# Patient Record
Sex: Male | Born: 1960 | Race: White | Hispanic: No | Marital: Married | Smoking: Former smoker
Health system: Southern US, Community
[De-identification: ages and names within clinical notes are randomized; demographics above are authoritative.]

## PROBLEM LIST (undated history)

## (undated) DIAGNOSIS — K219 Gastro-esophageal reflux disease without esophagitis: Secondary | ICD-10-CM

## (undated) DIAGNOSIS — K4090 Unilateral inguinal hernia, without obstruction or gangrene, not specified as recurrent: Secondary | ICD-10-CM

## (undated) DIAGNOSIS — K8020 Calculus of gallbladder without cholecystitis without obstruction: Secondary | ICD-10-CM

## (undated) DIAGNOSIS — I10 Essential (primary) hypertension: Secondary | ICD-10-CM

## (undated) DIAGNOSIS — M1612 Unilateral primary osteoarthritis, left hip: Secondary | ICD-10-CM

## (undated) DIAGNOSIS — R7303 Prediabetes: Secondary | ICD-10-CM

## (undated) DIAGNOSIS — J189 Pneumonia, unspecified organism: Secondary | ICD-10-CM

## (undated) DIAGNOSIS — M1990 Unspecified osteoarthritis, unspecified site: Secondary | ICD-10-CM

## (undated) DIAGNOSIS — E7800 Pure hypercholesterolemia, unspecified: Secondary | ICD-10-CM

## (undated) DIAGNOSIS — T7840XA Allergy, unspecified, initial encounter: Secondary | ICD-10-CM

## (undated) DIAGNOSIS — J439 Emphysema, unspecified: Secondary | ICD-10-CM

## (undated) DIAGNOSIS — I700 Atherosclerosis of aorta: Secondary | ICD-10-CM

## (undated) HISTORY — DX: Allergy, unspecified, initial encounter: T78.40XA

## (undated) HISTORY — DX: Pure hypercholesterolemia, unspecified: E78.00

## (undated) HISTORY — DX: Unspecified osteoarthritis, unspecified site: M19.90

## (undated) HISTORY — DX: Gastro-esophageal reflux disease without esophagitis: K21.9

## (undated) HISTORY — DX: Essential (primary) hypertension: I10

## (undated) HISTORY — PX: COLONOSCOPY WITH ESOPHAGOGASTRODUODENOSCOPY (EGD): SHX5779

---

## 2008-09-28 ENCOUNTER — Emergency Department: Payer: Self-pay | Admitting: Emergency Medicine

## 2010-08-09 ENCOUNTER — Ambulatory Visit: Payer: Self-pay | Admitting: Family Medicine

## 2012-10-28 HISTORY — PX: OTHER SURGICAL HISTORY: SHX169

## 2013-02-22 ENCOUNTER — Ambulatory Visit: Payer: Self-pay

## 2013-03-02 ENCOUNTER — Ambulatory Visit: Payer: Self-pay | Admitting: Orthopedic Surgery

## 2013-03-02 HISTORY — PX: ORIF METACARPAL FRACTURE: SUR940

## 2013-03-31 ENCOUNTER — Ambulatory Visit: Payer: Self-pay | Admitting: Family Medicine

## 2013-04-10 ENCOUNTER — Ambulatory Visit: Payer: Self-pay | Admitting: Family Medicine

## 2013-04-22 ENCOUNTER — Ambulatory Visit: Payer: Self-pay | Admitting: Orthopedic Surgery

## 2013-04-24 ENCOUNTER — Ambulatory Visit: Payer: Self-pay | Admitting: Orthopedic Surgery

## 2014-05-09 ENCOUNTER — Ambulatory Visit: Payer: Self-pay | Admitting: Gastroenterology

## 2014-08-12 ENCOUNTER — Emergency Department: Payer: Self-pay | Admitting: Student

## 2014-08-12 LAB — COMPREHENSIVE METABOLIC PANEL
Albumin: 4.1 g/dL (ref 3.4–5.0)
Alkaline Phosphatase: 86 U/L
Anion Gap: 10 (ref 7–16)
BUN: 18 mg/dL (ref 7–18)
Bilirubin,Total: 0.5 mg/dL (ref 0.2–1.0)
CALCIUM: 8.9 mg/dL (ref 8.5–10.1)
CO2: 23 mmol/L (ref 21–32)
Chloride: 108 mmol/L — ABNORMAL HIGH (ref 98–107)
Creatinine: 0.84 mg/dL (ref 0.60–1.30)
Glucose: 119 mg/dL — ABNORMAL HIGH (ref 65–99)
Potassium: 4.7 mmol/L (ref 3.5–5.1)
SGOT(AST): 19 U/L (ref 15–37)
SGPT (ALT): 36 U/L
Sodium: 141 mmol/L (ref 136–145)
Total Protein: 8 g/dL (ref 6.4–8.2)

## 2014-08-12 LAB — URINALYSIS, COMPLETE
BLOOD: NEGATIVE
Bacteria: NONE SEEN
Bilirubin,UR: NEGATIVE
GLUCOSE, UR: NEGATIVE mg/dL (ref 0–75)
Ketone: NEGATIVE
Leukocyte Esterase: NEGATIVE
Nitrite: NEGATIVE
PROTEIN: NEGATIVE
Ph: 5 (ref 4.5–8.0)
RBC,UR: NONE SEEN /HPF (ref 0–5)
Specific Gravity: 1.018 (ref 1.003–1.030)
Squamous Epithelial: NONE SEEN
WBC UR: 2 /HPF (ref 0–5)

## 2014-08-12 LAB — LIPASE, BLOOD: Lipase: 102 U/L (ref 73–393)

## 2014-08-12 LAB — CBC WITH DIFFERENTIAL/PLATELET
BASOS ABS: 0.1 10*3/uL (ref 0.0–0.1)
BASOS PCT: 0.8 %
Eosinophil #: 0.1 10*3/uL (ref 0.0–0.7)
Eosinophil %: 0.9 %
HCT: 44.4 % (ref 40.0–52.0)
HGB: 14.4 g/dL (ref 13.0–18.0)
LYMPHS PCT: 8.8 %
Lymphocyte #: 1 10*3/uL (ref 1.0–3.6)
MCH: 30.9 pg (ref 26.0–34.0)
MCHC: 32.3 g/dL (ref 32.0–36.0)
MCV: 96 fL (ref 80–100)
Monocyte #: 0.6 x10 3/mm (ref 0.2–1.0)
Monocyte %: 4.7 %
NEUTROS ABS: 9.9 10*3/uL — AB (ref 1.4–6.5)
Neutrophil %: 84.8 %
Platelet: 279 10*3/uL (ref 150–440)
RBC: 4.65 10*6/uL (ref 4.40–5.90)
RDW: 12.6 % (ref 11.5–14.5)
WBC: 11.7 10*3/uL — ABNORMAL HIGH (ref 3.8–10.6)

## 2014-08-12 LAB — TROPONIN I: Troponin-I: <0.02 ng/mL

## 2014-10-19 DIAGNOSIS — E669 Obesity, unspecified: Secondary | ICD-10-CM | POA: Insufficient documentation

## 2015-02-17 NOTE — Op Note (Signed)
PATIENT NAME:  Jason Dickson, [name] MR#:  [id number] DATE OF BIRTH:  [birth year]  DATE OF PROCEDURE:  03/02/2013  PREOPERATIVE DIAGNOSIS: Base of fifth metacarpal fracture.   POSTOPERATIVE DIAGNOSIS: Base of fifth metacarpal fracture.   PROCEDURE: Open reduction and internal fixation of right fifth metacarpal fracture.   ANESTHESIA: General.   SURGEON: Leitha SchullerMichael J. Alizae Bechtel, M.D.   DESCRIPTION OF PROCEDURE: The patient was brought to the Operating Room and after adequate anesthesia was obtained, the right arm was prepped and draped in the usual sterile fashion with a tourniquet applied to the upper arm. After prepping and draping in the usual sterile fashion, patient identification and timeout procedures were completed. The tourniquet was raised to 250 mmHg. A dorsal ulnar incision was made centered over the proximal fifth metacarpal. Skin and subcutaneous tissue were divided, preserving cutaneous nerve branches as much as possible. The extensor tendons of the little finger were identified and protected, the fracture site visualized and reduction carried out by applying direct pressure to the ulnar-sided fragment. A small T-plate was then shaped and cut short to fit this fragment and applied to the shaft proximally, with 2 screws at the most proximal end to help capture the fragment. The plate was then attached using first nonlocking and then locking screws to fix it to the shaft. Mini C-arm views were obtained during the procedure that showed excellent alignment, with reduction of the displaced intra-articular fragment. After the 6 screws had been placed, the wound was irrigated and closed with a 3-0 Vicryl to close the periosteum over the plate, 4-0 Monocryl subcutaneously and Dermabond. Then, 10 mL of 0.5% Sensorcaine were infiltrated around the incision to aid in postoperative analgesia. Telfa, 4 x 4's, Webril and a volar splint were applied followed by an Ace wrap. Tourniquet was let down at the close of the  case. Tourniquet time was 47 minutes at 250 mmHg.   ____________________________ Leitha SchullerMichael J. Aneth Schlagel, MD mjm:jm D: 03/02/2013 17:44:07 ET T: 03/02/2013 21:07:09 ET JOB#: [id number]  cc: Leitha SchullerMichael J. Stellarose Cerny, MD, <Dictator> Leitha SchullerMICHAEL J Ayuub Penley MD ELECTRONICALLY SIGNED 03/03/2013 8:06

## 2015-04-20 DIAGNOSIS — R7303 Prediabetes: Secondary | ICD-10-CM | POA: Insufficient documentation

## 2015-09-04 ENCOUNTER — Other Ambulatory Visit: Payer: Self-pay | Admitting: Family Medicine

## 2015-09-04 NOTE — Telephone Encounter (Signed)
Apt pe 

## 2015-09-05 NOTE — Telephone Encounter (Signed)
Spoke to patient about making appt for his CPE.  Pt stated he was work and would need to call back.

## 2015-12-04 ENCOUNTER — Other Ambulatory Visit: Payer: Self-pay | Admitting: Family Medicine

## 2016-03-02 ENCOUNTER — Other Ambulatory Visit: Payer: Self-pay | Admitting: Family Medicine

## 2016-03-04 ENCOUNTER — Encounter: Payer: Self-pay | Admitting: Family Medicine

## 2016-03-04 NOTE — Telephone Encounter (Signed)
Apt with anyone 

## 2016-03-04 NOTE — Telephone Encounter (Signed)
Letter sent.

## 2016-10-15 DIAGNOSIS — R3912 Poor urinary stream: Secondary | ICD-10-CM | POA: Insufficient documentation

## 2016-10-15 DIAGNOSIS — N528 Other male erectile dysfunction: Secondary | ICD-10-CM | POA: Insufficient documentation

## 2016-10-15 DIAGNOSIS — N401 Enlarged prostate with lower urinary tract symptoms: Secondary | ICD-10-CM | POA: Insufficient documentation

## 2016-10-15 DIAGNOSIS — I10 Essential (primary) hypertension: Secondary | ICD-10-CM | POA: Insufficient documentation

## 2018-07-31 DIAGNOSIS — M5416 Radiculopathy, lumbar region: Secondary | ICD-10-CM | POA: Insufficient documentation

## 2018-09-07 ENCOUNTER — Ambulatory Visit: Payer: BLUE CROSS/BLUE SHIELD | Attending: Family Medicine

## 2018-09-07 DIAGNOSIS — M545 Low back pain: Secondary | ICD-10-CM | POA: Diagnosis present

## 2018-09-07 DIAGNOSIS — G8929 Other chronic pain: Secondary | ICD-10-CM | POA: Insufficient documentation

## 2018-09-07 DIAGNOSIS — M6281 Muscle weakness (generalized): Secondary | ICD-10-CM | POA: Diagnosis present

## 2018-09-07 DIAGNOSIS — M5417 Radiculopathy, lumbosacral region: Secondary | ICD-10-CM | POA: Diagnosis present

## 2018-09-07 NOTE — Therapy (Signed)
[facility] PHYSICAL [facility]ORTS MEDICINE [address], [place], [address] Phone: [phone]   Fax:  [phone]  Physical Therapy Evaluation  Patient Details  Name: Jason Dickson MRN: [id number] Date of Birth: [birth year] Referring Provider (PT): Marisue Ivan, MD   Encounter Date: 09/07/2018  PT End of Session - 09/07/18 1737    Visit Number  1    Number of Visits  13    Date for PT Re-Evaluation  10/22/18    PT Start Time  1737    PT Stop Time  1849    PT Time Calculation (min)  72 min    Activity Tolerance  Patient tolerated treatment well    Behavior During Therapy  [facility] for tasks assessed/performed       Past Medical History:  Diagnosis Date  . Allergy    Information from [facility]  . Arthritis    Information from [facility]  . GERD (gastroesophageal reflux disease)    Information from [facility]  . Hypercholesterolemia    Information from [facility]  . Hypertension    Information from [facility]    Past Surgical History:  Procedure Laterality Date  . ORIF METACARPAL FRACTURE Right 03/02/2013   Information from [facility]; 5th metacarpal    There were no vitals filed for this visit.   Subjective Assessment - 09/07/18 1739    Subjective  Low back pain (pain sometimes L upper back when lying in supine): 0/10 currently, 8.5/10 at most for the past 3 months (supine position). LE symptoms (L5 dermatome) L > R: numb/tingling/burn sensation.   Has not gone past the knees.     Pertinent History  Chronic lumbar radiculopathy. Pt has had back pain for 2 years, gradual onset. Currently unable to lie flat on his back or L side. Able to lie on his R side. Today is also his last day taking prednisone. Medications helped decrease his back pain a little bit.  Able to get 2.5 hours of sleep at a time prior to his  pain waking him up. Used to be able to bend and twist to make it feel better.  Pt also states that his L leg would tingle > R.  Walking 25 yards feels like walking 10 miles. Legs, hips, and the top of his tail bone would burn like he was working out.  Not being able to do anything makes him increase weight. His physical activities are limited outside of working.  Pt currently is on his feet running machines.   Hips also feel like its rubbing when he walks.  Back also went out around 1999. Went to [place] which made his back feel better.  He was told that one leg is shorter than the other.   Denies loss of bowel or bladder control.  Denies saddle anesthesia.    Back pain has gradually gotten worse since onset 2 years ago.     Patient Stated Goals  Be able to bend and not hurt. Be able to sleep for 7 hours.     Currently in Pain?  No/denies    Pain Score  0-No pain    Pain Location  Back    Pain Orientation  Posterior    Pain Descriptors / Indicators  Aching;Burning   low back and LE burning   Pain Type  Chronic pain    Pain Onset  More  than a month ago    Pain Frequency  Occasional    Aggravating Factors   Laying supine or on his L side. R S/L eventually increases pain. Sitting, standing, walking     Pain Relieving Factors  leaning forward, twisting (causes his back to pop and feel better). Ice pack helps. Seated piriformis stretch hurt initially but feels a lot better afterwards.       Information from [facility]:  (from Marisue Ivan, MD notes on 08/27/2018)  Current Outpatient Medications: CIALIS 20 mg tablet, TAKE 1 TABLET (20 MG TOTAL) BY MOUTH ONCE DAILY AS NEEDED FOR ERECTILE DYSFUNCTION., Taking fluticasone (FLONASE) 50 mcg/actuation nasal spray, Place 1 spray into both nostrils once daily, Taking gabapentin (NEURONTIN) 300 MG capsule, Take 2 capsules (600 mg total) by mouth nightly loratadine (CLARITIN) 10 mg tablet, Take 10 mg by mouth once daily as needed.  , Taking omeprazole (PRILOSEC OTC) 20 MG tablet, Take 20 mg by mouth once daily., Taking simvastatin (ZOCOR) 40 MG tablet, TAKE 1 TABLET (40 MG TOTAL) BY MOUTH EVERY MORNING., Taking tamsulosin (FLOMAX) 0.4 mg capsule, TAKE 1 CAPSULE (0.4 MG TOTAL) BY MOUTH ONCE DAILY. TAKE 30 MINUTES AFTER SAME MEAL EACH DAY., Taking lisinopril (PRINIVIL,ZESTRIL) 5 MG tablet, Take 1 tablet (5 mg total) by mouth once daily predniSONE (DELTASONE) 10 MG tablet, 4 tabs po daily x 3 days, 3 tabs po daily x 3 days, 2 tabs po daily x 3 days, 1 tab po daily x 3 days  Allergies as of 08/27/2018  . (No Known Allergies)   Patient Active Problem List  Diagnosis  . Pure hypercholesterolemia (LDL 93 - 01/02/18)  . Arthritis  . Mild obesity, unspecified  . Borderline diabetes mellitus (A1c 5.9% - 01/02/18) - diet controlled  . Vaccine counseling: TDAP vaccine administered on 04/22/16 (04/22/16)  . Essential hypertension  . Benign prostatic hyperplasia with weak urinary stream  . Other male erectile dysfunction  . Chronic lumbar radiculopathy   Past Medical History:  Diagnosis Date  . Allergy 02/13/62  . Arthritis  . Essential hypertension  . GERD (gastroesophageal reflux disease)  . Pure hypercholesterolemia   Past Surgical History:  Procedure Laterality Date  . FRACTURE SURGERY ?  broken hand  . ORIF METACARPAL FRACTURE Right 03/02/13  Fifth metacarpal  . Right shoulder repair 07/2013     [facility] PT Assessment - 09/07/18 1800      Assessment   Medical Diagnosis  Chronic lumbar radiculopathy    Referring Provider (PT)  Marisue Ivan, MD    Onset Date/Surgical Date  07/31/18   Date PT referral signed. Chronic condition.   Prior Therapy  Pt had chiropractic treatment before which helped.       Precautions   Precaution Comments  no known precautions      Restrictions   Other Position/Activity Restrictions  no known restrictions      Prior Function   Vocation  Full time employment    Vocation  Requirements  PLOF: better able to ambulate longer distances, tolerate standing, supine, and L S/L with less back pain.       Observation/Other Assessments   Observations  Supine position: L LE longer, long sit position: L LE longer    Focus on Therapeutic Outcomes (FOTO)   Lumbar Spine FOTO: 54      Posture/Postural Control   Posture Comments  R lateral shift, B protracted shoulders and neck, kyphosis, decreased lumbar lordosis, decreased B hip extension.  AROM   Lumbar Flexion  WFL, slight L trunk rotation   Feels good for back   Lumbar Extension  limited, with movement preference to L5/S1 area, limited thoracic extension. Reproduced low back pain.     Lumbar - Right Side [facility] with slight reproduction of back pain    Lumbar - Left Side Bend  Limited with R trunk pain.     Lumbar - Right Rotation  WFL   sitting   Lumbar - Left Rotation  WFL   sitting     Strength   Right Hip Flexion  4+/5    Right Hip Extension  4-/5   seated manually resisted   Right Hip ABduction  4/5   seated manually resisted clamshell   Left Hip Flexion  4/5    Left Hip Extension  4-/5   seated manually resisted   Left Hip ABduction  4/5   seated manually resisted clamshell   Right Knee Flexion  4+/5    Right Knee Extension  5/5    Left Knee Flexion  4+/5    Left Knee Extension  5/5      Palpation   Palpation comment  TTP L T12, L4, L5 transverse processes.       Ambulation/Gait   Gait Comments  decreased stance R LE, L femoral adduction during L LE stance phase, R trunk side bend                Objective measurements completed on examination: See above findings.    Pt states blood pressure is controlled.     No latex band allergies   Medbridge Access Code: [email or web address]    Therapeutic exercise   Standing R lateral shift correction 5x5 seconds for 2 sets. L LE symptoms which eases with rest.    standing scapular retraction resisting red band 5x5 seconds. L upper back  discomfort which eases with rest  Sitting scapular retraction resisting red band 2x5 seoconds, R upper back discomfort which eases with rest.   Seated B scapular retraction resisting yellow band 10x5 seconds  Standing glute max squeeze 10x5 seconds  Reviewed HEP. Pt demonstrated and verbalized understanding.     Improved exercise technique, movement at target joints, use of target muscles after mod verbal, visual, tactile cues.    Patient is a 57 year old male who came to physical therapy secondary to chronic low back pain with radiating symptoms. He also presents with altered gait pattern and posture, B hip weakness, TTP to L low back compared to R, radiating symptoms around L 5 dermatome, and difficulty performing functional tasks such as walking or tolerating positions involving lumbar extension. Patient will benefit from skilled physical therapy services to address the aforementioned deficits.                PT Education - 09/07/18 1940    Education Details  Ther-ex, HEP, plan of care    Person(s) Educated  Patient    Methods  Explanation;Demonstration;Tactile cues;Verbal cues;Handout    Comprehension  Returned demonstration;Verbalized understanding       PT Short Term Goals - 09/07/18 1913      PT SHORT TERM GOAL #1   Title  Patient will be independent with his HEP to decrease pain, improve strength and function.     Baseline  Pt has started his HEP. 09/07/2018    Time  3    Period  Weeks    Status  New    Target Date  10/01/18        PT Long Term Goals - 09/07/18 1914      PT LONG TERM GOAL #1   Title  Patient will have a decrease in back pain to 4/10 or less at worst to promote ability to ambulate longer distances, perform standing tasks, improve ability to sleep.     Baseline  8.5/10 back pain at worst for the past 3 months (09/07/2018)    Time  6    Period  Weeks    Status  New    Target Date  10/22/18      PT LONG TERM GOAL #2   Title  Pt will  improve B hip strength by at least 1/2 MMT grade to promote ability to perform standing tasks with less back and LE pain.     Time  6    Period  Weeks    Status  New    Target Date  10/22/18      PT LONG TERM GOAL #3   Title  Patient will report being able to sleep at least 5 hours straight without his back pain waking him up to promote ability to rest.     Baseline  Pt able to sleep 2.5 hours at a time due to back pain waking him up (09/07/2018)    Time  6    Period  Weeks    Status  New    Target Date  10/22/18      PT LONG TERM GOAL #4   Title  Patient will improve his lumbar spine FOTO score by at least 10 points as a demonstration of improved function.     Baseline  FOTO 54 (09/07/2018)    Time  6    Period  Weeks    Status  New    Target Date  10/22/18             Plan - 09/07/18 1903    Clinical Impression Statement  Patient is a 57 year old male who came to physical therapy secondary to chronic low back pain with radiating symptoms. He also presents with altered gait pattern and posture, B hip weakness, TTP to L low back compared to R, radiating symptoms around L 5 dermatome, and difficulty performing functional tasks such as walking or tolerating positions involving lumbar extension. Patient will benefit from skilled physical therapy services to address the aforementioned deficits.     History and Personal Factors relevant to plan of care:  Chronicity of condition, difficulty walking longer distances, standing and laying on his back or side for long periods due to pain, leg length difference    Clinical Presentation  Evolving    Clinical Presentation due to:  patient states pain has gradually worsened since onset.    Clinical Decision Making  Moderate    Rehab Potential  Fair    Clinical Impairments Affecting Rehab Potential  Chronicity of condition    PT Frequency  2x / week    PT Duration  6 weeks    PT Treatment/Interventions  Therapeutic exercise;Neuromuscular  re-education;Therapeutic activities;Patient/family education;Manual techniques;Aquatic Therapy;Electrical Stimulation;Iontophoresis 4mg /ml Dexamethasone;Traction;Ultrasound    PT Next Visit Plan  thoracic, hip extension, glute and trunk strengthening, manual techniques, modalities PRN    Consulted and Agree with Plan of Care  Patient       Patient will benefit from skilled therapeutic intervention in order to improve the following deficits and impairments:  Pain, Improper body mechanics, Postural dysfunction, Decreased strength, Difficulty  walking  Visit Diagnosis: Chronic bilateral low back pain, unspecified whether sciatica present - Plan: PT plan of care cert/re-cert  Radiculopathy, lumbosacral region - Plan: PT plan of care cert/re-cert  Muscle weakness (generalized) - Plan: PT plan of care cert/re-cert     Problem List There are no active problems to display for this patient.   Loralyn Freshwater PT, DPT   09/07/2018, 7:50 PM  [facility] PHYSICAL AND SPORTS MEDICINE [address], [place], [address] Phone: [phone]   Fax:  [phone]  Name: Jason Dickson MRN: [id number] Date of Birth: 24-Jul-1961

## 2018-09-07 NOTE — Patient Instructions (Addendum)
Medbridge Access Code: [email or web address]    Standing scapular retraction 10x3 with 5 second holds   Standing glute max squeeze 10x5 seconds

## 2018-09-09 ENCOUNTER — Ambulatory Visit: Payer: BLUE CROSS/BLUE SHIELD

## 2018-09-09 DIAGNOSIS — M5417 Radiculopathy, lumbosacral region: Secondary | ICD-10-CM

## 2018-09-09 DIAGNOSIS — G8929 Other chronic pain: Secondary | ICD-10-CM

## 2018-09-09 DIAGNOSIS — M545 Low back pain, unspecified: Secondary | ICD-10-CM

## 2018-09-09 DIAGNOSIS — M6281 Muscle weakness (generalized): Secondary | ICD-10-CM

## 2018-09-09 NOTE — Patient Instructions (Addendum)
  Sitting on a chair    Press your hands on your thighs to feel your abdominal muscles contract.   Hold for 5 seconds comfortably.   Repeat 10 times.   Perform 3 sets daily.      1/4 inch heel lift provided for pt for R foot secondary to L LE being longer   Pt can remove it if it bothers him. Pt verbalized understanding.

## 2018-09-09 NOTE — Therapy (Signed)
[facility]ICINE [address], [place], [address] Phone: [phone]   Fax:  [phone]  Physical Therapy Treatment  Patient Details  Name: Jason Dickson MRN: [id number] Date of Birth: Jul 03, 1961 Referring Provider (PT): Marisue Ivan, MD   Encounter Date: 09/09/2018  PT End of Session - 09/09/18 1749    Visit Number  2    Number of Visits  13    Date for PT Re-Evaluation  10/22/18    PT Start Time  1749    PT Stop Time  1836    PT Time Calculation (min)  47 min    Activity Tolerance  Patient tolerated treatment well    Behavior During Therapy  [facility] for tasks assessed/performed       Past Medical History:  Diagnosis Date  . Allergy    Information from [facility]  . Arthritis    Information from [facility]  . GERD (gastroesophageal reflux disease)    Information from [facility]  . Hypercholesterolemia    Information from [facility]  . Hypertension    Information from [facility]    Past Surgical History:  Procedure Laterality Date  . ORIF METACARPAL FRACTURE Right 03/02/2013   Information from [facility]; 5th metacarpal    There were no vitals filed for this visit.  Subjective Assessment - 09/09/18 1752    Subjective  L lateral leg has been tingling since this past Sunday (4 days ago). 0/10 back pain. Feels R low back soreness on the R side. The HEP is ok. Feels some soreness in low back (around L5/S1 area)     Pertinent History  Chronic lumbar radiculopathy. Pt has had back pain for 2 years, gradual onset. Currently unable to lie flat on his back or L side. Able to lie on his R side. Today is also his last day taking prednisone. Medications helped decrease his back pain a little bit.  Able to get 2.5 hours of sleep at a time prior to his pain waking him up. Used to be able to bend  and twist to make it feel better.  Pt also states that his L leg would tingle > R.  Walking 25 yards feels like walking 10 miles. Legs, hips, and the top of his tail bone would burn like he was working out.  Not being able to do anything makes him increase weight. His physical activities are limited outside of working.  Pt currently is on his feet running machines.   Hips also feel like its rubbing when he walks.  Back also went out around 1999. [place] which made his back feel better.  He was told that one leg is shorter than the other.   Denies loss of bowel or bladder control.  Denies saddle anesthesia.    Back pain has gradually gotten worse since onset 2 years ago.     Patient Stated Goals  Be able to bend and not hurt. Be able to sleep for 7 hours.     Currently in Pain?  No/denies    Pain Onset  More than a month ago                               PT Education - 09/09/18 1823    Education Details  Ther-ex, HEP  Person(s) Educated  Patient    Methods  Explanation;Demonstration;Tactile cues;Verbal cues;Handout    Comprehension  Returned demonstration;Verbalized understanding        Objective   Pt states blood pressure is controlled.   No latex band allergies   Medbridge Access Code: [email or web address]    Therapeutic exercise  Seated physioball rolls   Flexion 10x5 seconds  To the L 10x5 seconds  To the R 10x5 seconds   Seated transversus abdominis contraction 10x5 seconds for 3 sets  Seated B shoulder extension isometrics, hands on thighs 10x2 with 5 second holds. No L anterior thigh numbness afterwards   Reviewed and given as part of his HEP. Pt demonstrated and verbalized understanding.   1/4 inch heel lift provided for pt for R foot secondary to L LE being longer   Pt can remove it if it bothers him. Pt verbalized understanding.    Standing leg press resisting double blue band and black theratube   L 10x3  R 10x2. More  difficult. L lateral thigh tiredness sensation in which leg length difference might play a factor.    Improved exercise technique, movement at target joints, use of target muscles after mod verbal, visual, tactile cues.    Manual therapy  Seated STM to low back paraspinal muscle  Decreased low back tightness afterwards   Decreased low back pain and LE symptoms with treatment to decrease low back extension pressure, low back paraspinal muscle tension as well as with trunk and glute muscle activation.           PT Short Term Goals - 09/07/18 1913      PT SHORT TERM GOAL #1   Title  Patient will be independent with his HEP to decrease pain, improve strength and function.     Baseline  Pt has started his HEP. 09/07/2018    Time  3    Period  Weeks    Status  New    Target Date  10/01/18        PT Long Term Goals - 09/07/18 1914      PT LONG TERM GOAL #1   Title  Patient will have a decrease in back pain to 4/10 or less at worst to promote ability to ambulate longer distances, perform standing tasks, improve ability to sleep.     Baseline  8.5/10 back pain at worst for the past 3 months (09/07/2018)    Time  6    Period  Weeks    Status  New    Target Date  10/22/18      PT LONG TERM GOAL #2   Title  Pt will improve B hip strength by at least 1/2 MMT grade to promote ability to perform standing tasks with less back and LE pain.     Time  6    Period  Weeks    Status  New    Target Date  10/22/18      PT LONG TERM GOAL #3   Title  Patient will report being able to sleep at least 5 hours straight without his back pain waking him up to promote ability to rest.     Baseline  Pt able to sleep 2.5 hours at a time due to back pain waking him up (09/07/2018)    Time  6    Period  Weeks    Status  New    Target Date  10/22/18      PT LONG TERM GOAL #4  Title  Patient will improve his lumbar spine FOTO score by at least 10 points as a demonstration of improved function.      Baseline  FOTO 54 (09/07/2018)    Time  6    Period  Weeks    Status  New    Target Date  10/22/18            Plan - 09/09/18 1828    Clinical Impression Statement  Decreased low back pain and LE symptoms with treatment to decrease low back extension pressure, low back paraspinal muscle tension as well as with trunk and glute muscle activation.      Rehab Potential  Fair    Clinical Impairments Affecting Rehab Potential  Chronicity of condition    PT Frequency  2x / week    PT Duration  6 weeks    PT Treatment/Interventions  Therapeutic exercise;Neuromuscular re-education;Therapeutic activities;Patient/family education;Manual techniques;Aquatic Therapy;Electrical Stimulation;Iontophoresis 4mg /ml Dexamethasone;Traction;Ultrasound    PT Next Visit Plan  thoracic, hip extension, glute and trunk strengthening, manual techniques, modalities PRN    Consulted and Agree with Plan of Care  Patient       Patient will benefit from skilled therapeutic intervention in order to improve the following deficits and impairments:  Pain, Improper body mechanics, Postural dysfunction, Decreased strength, Difficulty walking  Visit Diagnosis: Chronic bilateral low back pain, unspecified whether sciatica present  Radiculopathy, lumbosacral region  Muscle weakness (generalized)     Problem List There are no active problems to display for this patient.   Loralyn Freshwater PT, DPT   09/09/2018, 6:45 PM  [facility] PHYSICAL AND SPORTS MEDICINE [address] [place], [address] Phone: [phone]   Fax:  [phone]  Name: Jason Dickson MRN: [id number] Date of Birth: Mar 09, 1961

## 2018-09-14 ENCOUNTER — Ambulatory Visit: Payer: BLUE CROSS/BLUE SHIELD

## 2018-09-14 DIAGNOSIS — M6281 Muscle weakness (generalized): Secondary | ICD-10-CM

## 2018-09-14 DIAGNOSIS — G8929 Other chronic pain: Secondary | ICD-10-CM

## 2018-09-14 DIAGNOSIS — M5417 Radiculopathy, lumbosacral region: Secondary | ICD-10-CM

## 2018-09-14 DIAGNOSIS — M545 Low back pain: Principal | ICD-10-CM

## 2018-09-14 NOTE — Patient Instructions (Addendum)
     Sitting on a chair:   Open your arms wide to feel your shoulder blade muscles squeeze behind you.   Hold for 5 seconds.    Repeat 10 times   Perform 3 sets daily.      MedbridgeAccess Code: [email or web address]  Chin tucks 10x3 with 5 second holds    Pt was recommended to prop his L foot up on something at work to help with back and L LE symptoms when performing standing tasks. Pt demonstrated and verbalized understanding.

## 2018-09-14 NOTE — Therapy (Signed)
[place] [facility] PHYS[facility]ORTS MEDICINE [address] [place], [address] Phone: [phone]   Fax:  [phone]  Physical Therapy Treatment  Patient Details  Name: Jason Dickson MRN: [id number] Date of Birth: 1961-05-15 Referring Provider (PT): Marisue Ivan, MD   Encounter Date: 09/14/2018  PT End of Session - 09/14/18 1734    Visit Number  3    Number of Visits  13    Date for PT Re-Evaluation  10/22/18    PT Start Time  1734    PT Stop Time  1818    PT Time Calculation (min)  44 min    Activity Tolerance  Patient tolerated treatment well    Behavior During Therapy  [facility] for tasks assessed/performed       Past Medical History:  Diagnosis Date  . Allergy    Information from [facility]  . Arthritis    Information from [facility]  . GERD (gastroesophageal reflux disease)    Information from [facility]  . Hypercholesterolemia    Information from [facility]  . Hypertension    Information from [facility]    Past Surgical History:  Procedure Laterality Date  . ORIF METACARPAL FRACTURE Right 03/02/2013   Information from [facility]; 5th metacarpal    There were no vitals filed for this visit.  Subjective Assessment - 09/14/18 1736    Subjective  Friday, Saturday, Sunday his L lateral leg did not tingle. L LE tingled today. Wore his heel lift.  No back pain currently. Just a little bit of tingling L lateral thigh.     Pertinent History  Chronic lumbar radiculopathy. Pt has had back pain for 2 years, gradual onset. Currently unable to lie flat on his back or L side. Able to lie on his R side. Today is also his last day taking prednisone. Medications helped decrease his back pain a little bit.  Able to get 2.5 hours of sleep at a time prior to his pain waking him up. Used to be able to bend and twist to make it  feel better.  Pt also states that his L leg would tingle > R.  Walking 25 yards feels like walking 10 miles. Legs, hips, and the top of his tail bone would burn like he was working out.  Not being able to do anything makes him increase weight. His physical activities are limited outside of working.  Pt currently is on his feet running machines.   Hips also feel like its rubbing when he walks.  Back also went out around 1999. Went to [place] which made his back feel better.  He was told that one leg is shorter than the other.   Denies loss of bowel or bladder control.  Denies saddle anesthesia.    Back pain has gradually gotten worse since onset 2 years ago.     Patient Stated Goals  Be able to bend and not hurt. Be able to sleep for 7 hours.     Currently in Pain?  No/denies    Pain Onset  More than a month ago                               PT Education - 09/14/18 1804    Education Details  ther-ex, HEP    Person(s) Educated  Patient  Methods  Explanation;Demonstration;Tactile cues;Verbal cues    Comprehension  Returned demonstration;Verbalized understanding       Objective   Pt states blood pressure is controlled.   No latex band allergies   MedbridgeAccess Code: [email or web address]   Therapeutic exercise  Seated physioball rolls              Flexion 10x5 seconds. L thigh numbness             To the L 10x5 seconds. No symptoms    Standing open books at corner wall 10x5 seconds  Seated open books 10x5 seconds for 2 sets to promote thoracic extension  Seated chin tucks 10x5 seconds for 2 sets  Standing T-band scapular retraction resisting blue band 10x5 seconds  Then 5x10 seconds  Standing hip extension resisting red band  L 10x3   R 10x2. More difficult compared to L LE  Standing leg press resisting double blue band and black theratube              L 10x2             R 10x2   Improved exercise technique, movement at target joints, use  of target muscles after mod verbal, visual, tactile cues.    Decreased back pain with gentle trunk flexion with side bending. Continued working on thoracic extension and glute strengthening to help decrease extension pressure to L low back when performing standing tasks. Good glute max muscle use felt by pt with exercises. No back and L LE symptoms after session.       PT Short Term Goals - 09/07/18 1913      PT SHORT TERM GOAL #1   Title  Patient will be independent with his HEP to decrease pain, improve strength and function.     Baseline  Pt has started his HEP. 09/07/2018    Time  3    Period  Weeks    Status  New    Target Date  10/01/18        PT Long Term Goals - 09/07/18 1914      PT LONG TERM GOAL #1   Title  Patient will have a decrease in back pain to 4/10 or less at worst to promote ability to ambulate longer distances, perform standing tasks, improve ability to sleep.     Baseline  8.5/10 back pain at worst for the past 3 months (09/07/2018)    Time  6    Period  Weeks    Status  New    Target Date  10/22/18      PT LONG TERM GOAL #2   Title  Pt will improve B hip strength by at least 1/2 MMT grade to promote ability to perform standing tasks with less back and LE pain.     Time  6    Period  Weeks    Status  New    Target Date  10/22/18      PT LONG TERM GOAL #3   Title  Patient will report being able to sleep at least 5 hours straight without his back pain waking him up to promote ability to rest.     Baseline  Pt able to sleep 2.5 hours at a time due to back pain waking him up (09/07/2018)    Time  6    Period  Weeks    Status  New    Target Date  10/22/18      PT LONG TERM GOAL #  4   Title  Patient will improve his lumbar spine FOTO score by at least 10 points as a demonstration of improved function.     Baseline  FOTO 54 (09/07/2018)    Time  6    Period  Weeks    Status  New    Target Date  10/22/18            Plan - 09/14/18 1733     Clinical Impression Statement  Decreased back pain with gentle trunk flexion with side bending. Continued working on thoracic extension and glute strengthening to help decrease extension pressure to L low back when performing standing tasks. Good glute max muscle use felt by pt with exercises. No back and L LE symptoms after session.     Rehab Potential  Fair    Clinical Impairments Affecting Rehab Potential  Chronicity of condition    PT Frequency  2x / week    PT Duration  6 weeks    PT Treatment/Interventions  Therapeutic exercise;Neuromuscular re-education;Therapeutic activities;Patient/family education;Manual techniques;Aquatic Therapy;Electrical Stimulation;Iontophoresis 4mg /ml Dexamethasone;Traction;Ultrasound    PT Next Visit Plan  thoracic, hip extension, glute and trunk strengthening, manual techniques, modalities PRN    Consulted and Agree with Plan of Care  Patient       Patient will benefit from skilled therapeutic intervention in order to improve the following deficits and impairments:  Pain, Improper body mechanics, Postural dysfunction, Decreased strength, Difficulty walking  Visit Diagnosis: Chronic bilateral low back pain, unspecified whether sciatica present  Radiculopathy, lumbosacral region  Muscle weakness (generalized)     Problem List There are no active problems to display for this patient.   Loralyn Freshwater PT, DPT   09/14/2018, 6:25 PM  [place] [facility] PHYSICAL AND SPORTS MEDICINE [address] [place], [address] Phone: [phone]   Fax:  [phone]  Name: Jason Dickson MRN: [id number] Date of Birth: Dec 29, 1960

## 2018-09-16 ENCOUNTER — Ambulatory Visit: Payer: BLUE CROSS/BLUE SHIELD

## 2018-09-16 DIAGNOSIS — G8929 Other chronic pain: Secondary | ICD-10-CM

## 2018-09-16 DIAGNOSIS — M5417 Radiculopathy, lumbosacral region: Secondary | ICD-10-CM

## 2018-09-16 DIAGNOSIS — M545 Low back pain, unspecified: Secondary | ICD-10-CM

## 2018-09-16 DIAGNOSIS — M6281 Muscle weakness (generalized): Secondary | ICD-10-CM

## 2018-09-16 NOTE — Therapy (Signed)
[facility] [address] [place], [place], [address] Phone: [phone]   Fax:  [phone]  Physical Therapy Treatment  Patient Details  Name: Jason Dickson MRN: [id number] Date of Birth: 03/13/1961 Referring Provider (PT): Marisue IvanKanhka Linthavong, MD   Encounter Date: 09/16/2018  PT End of Session - 09/16/18 1731    Visit Number  4    Number of Visits  13    Date for PT Re-Evaluation  10/22/18    PT Start Time  1733    PT Stop Time  1814    PT Time Calculation (min)  41 min    Activity Tolerance  Patient tolerated treatment well    Behavior During Therapy  [facility] for tasks assessed/performed       Past Medical History:  Diagnosis Date  . Allergy    [facility]  . Arthritis    [facility]  . GERD (gastroesophageal reflux disease)    [facility]  . Hypercholesterolemia    [facility]  . Hypertension    [facility]    Past Surgical History:  Procedure Laterality Date  . ORIF METACARPAL FRACTURE Right 03/02/2013   [facility]; 5th [facility]acarpal    There were no vitals filed for this visit.  Subjective Assessment - 09/16/18 1734    Subjective  L LE tingled off and on a couble of times. Had R low back ache yesterday. The only time he sat yesterday at work was to eat lunch. Back and L LE is better compared to before he started PT.  Less L LE tingling when pt props his L foot up.     Pertinent History  Chronic lumbar radiculopathy. Pt has had back pain for 2 years, gradual onset. Currently unable to lie flat on his back or L side. Able to lie on his R side. Today is also his last day taking prednisone. Medications helped decrease his back pain a little bit.  Able to get 2.5 hours of sleep at a time prior to his  pain waking him up. Used to be able to bend and twist to make it feel better.  Pt also states that his L leg would tingle > R.  Walking 25 yards feels like walking 10 miles. Legs, hips, and the top of his tail bone would burn like he was working out.  Not being able to do anything makes him increase weight. His physical activities are limited outside of working.  Pt currently is on his feet running machines.   Hips also feel like its rubbing when he walks.  Back also went out around 1999. Went to a [place] which made his back feel better.  He was told that one leg is shorter than the other.   Denies loss of bowel or bladder control.  Denies saddle anesthesia.    Back pain has gradually gotten worse since onset 2 years ago.     Patient Stated Goals  Be able to bend and not hurt. Be able to sleep for 7 hours.     Currently in Pain?  No/denies    Pain Score  0-No pain    Pain Onset  More than a month ago  PT Education - 09/16/18 1745    Education Details  ther-ex    Person(s) Educated  Patient    Methods  Explanation;Demonstration;Tactile cues;Verbal cues    Comprehension  Returned demonstration;Verbalized understanding        Objective   Pt states blood pressure is controlled.   No latex band allergies   MedbridgeAccess Code: [facility]   Therapeutic exercise  Seated physioball rolls   To the L 10x5 seconds for 2 sets  OMEGA rows resisting plate 20 for 16X0 seconds for 2 sets  Standing hip machine height 5    Hip extension    R plate 85 for 96E4   L plate 85 for 54U9   Standing B shoulder extension resisting green band 10x5 seconds for 3 sets   Standing leg press resisting double blue band and black theratube  L 10x2 R 10x2   Improved exercise technique, movement at target joints, use of target muscles after mod verbal, visual, tactile cues.   Pt seems to be  making progress with decreasing back and L LE symptoms based on subjective reports. Challenges to progress seem to include prolonged standing at work. Less L LE tingling when pt props his L foot up. Continued working on thoracic extension, trunk and glute strengthening to help decrease pressure to low back. Pt tolerated session well without aggravation of symptoms. No complain in low back or L LE symptoms at end of session.        PT Short Term Goals - 09/07/18 1913      PT SHORT TERM GOAL #1   Title  Patient will be independent with his HEP to decrease pain, improve strength and function.     Baseline  Pt has started his HEP. 09/07/2018    Time  3    Period  Weeks    Status  New    Target Date  10/01/18        PT Long Term Goals - 09/07/18 1914      PT LONG TERM GOAL #1   Title  Patient will have a decrease in back pain to 4/10 or less at worst to promote ability to ambulate longer distances, perform standing tasks, improve ability to sleep.     Baseline  8.5/10 back pain at worst for the past 3 months (09/07/2018)    Time  6    Period  Weeks    Status  New    Target Date  10/22/18      PT LONG TERM GOAL #2   Title  Pt will improve B hip strength by at least 1/2 MMT grade to promote ability to perform standing tasks with less back and LE pain.     Time  6    Period  Weeks    Status  New    Target Date  10/22/18      PT LONG TERM GOAL #3   Title  Patient will report being able to sleep at least 5 hours straight without his back pain waking him up to promote ability to rest.     Baseline  Pt able to sleep 2.5 hours at a time due to back pain waking him up (09/07/2018)    Time  6    Period  Weeks    Status  New    Target Date  10/22/18      PT LONG TERM GOAL #4   Title  Patient will improve his lumbar spine FOTO score by at least 10  points as a demonstration of improved function.     Baseline  FOTO 54 (09/07/2018)    Time  6    Period  Weeks    Status  New    Target  Date  10/22/18            Plan - 09/16/18 1730    Clinical Impression Statement  Pt seems to be making progress with decreasing back and L LE symptoms based on subjective reports. Challenges to progress seem to include prolonged standing at work. Less L LE tingling when pt props his L foot up. Continued working on thoracic extension, trunk and glute strengthening to help decrease pressure to low back. Pt tolerated session well without aggravation of symptoms. No complain in low back or L LE symptoms at end of session.     Rehab Potential  Fair    Clinical Impairments Affecting Rehab Potential  Chronicity of condition    PT Frequency  2x / week    PT Duration  6 weeks    PT Treatment/Interventions  Therapeutic exercise;Neuromuscular re-education;Therapeutic activities;Patient/family education;Manual techniques;Aquatic Therapy;Electrical Stimulation;Iontophoresis 4mg /ml Dexamethasone;Traction;Ultrasound    PT Next Visit Plan  thoracic, hip extension, glute and trunk strengthening, manual techniques, modalities PRN    Consulted and Agree with Plan of Care  Patient       Patient will benefit from skilled therapeutic intervention in order to improve the following deficits and impairments:  Pain, Improper body mechanics, Postural dysfunction, Decreased strength, Difficulty walking  Visit Diagnosis: Chronic bilateral low back pain, unspecified whether sciatica present  Radiculopathy, lumbosacral region  Muscle weakness (generalized)     Problem List There are no active problems to display for this patient.   Loralyn Freshwater PT, DPT   09/16/2018, 6:23 PM  [place] [facility] PHYSICAL AND SPORTS MEDICINE [address], [place], [address] Phone: [phone]   Fax:  [phone]  Name: Jason Dickson MRN: [id number] Date of Birth: 24-Apr-1961

## 2018-09-21 ENCOUNTER — Ambulatory Visit: Payer: BLUE CROSS/BLUE SHIELD

## 2018-09-21 DIAGNOSIS — M6281 Muscle weakness (generalized): Secondary | ICD-10-CM

## 2018-09-21 DIAGNOSIS — M545 Low back pain, unspecified: Secondary | ICD-10-CM

## 2018-09-21 DIAGNOSIS — G8929 Other chronic pain: Secondary | ICD-10-CM

## 2018-09-21 DIAGNOSIS — M5417 Radiculopathy, lumbosacral region: Secondary | ICD-10-CM

## 2018-09-21 NOTE — Therapy (Signed)
[place] [facility] [address] [place], [place], [address] Phone: [phone]   Fax:  [phone]  Physical Therapy Treatment  Patient Details  Name: Jason Dickson MRN: [id number] Date of Birth: 09/10/1961 Referring Provider (PT): Marisue IvanKanhka Linthavong, MD   Encounter Date: 09/21/2018  PT End of Session - 09/21/18 1735    Visit Number  5    Number of Visits  13    Date for PT Re-Evaluation  10/22/18    PT Start Time  1735    PT Stop Time  1817    PT Time Calculation (min)  42 min    Activity Tolerance  Patient tolerated treatment well    Behavior During Therapy  [facility] for tasks assessed/performed       Past Medical History:  Diagnosis Date  . Allergy    [facility]  . Arthritis    [facility] from [facility]  . GERD (gastroesophageal reflux disease)    Information from [facility]  . Hypercholesterolemia    [facility] from [facility]  . Hypertension    [facility]    Past Surgical History:  Procedure Laterality Date  . ORIF METACARPAL FRACTURE Right 03/02/2013   [facility]; 5th metacarpal    There were no vitals filed for this visit.  Subjective Assessment - 09/21/18 1736    Subjective  Has not had any tingling in his L leg since Friday. The R low back right above his belt is sore and tender. Still has difficulty walking after standing after sitting for a while.  The soreness in R low back has eased off.     Pertinent History  Chronic lumbar radiculopathy. Pt has had back pain for 2 years, gradual onset. Currently unable to lie flat on his back or L side. Able to lie on his R side. Today is also his last day taking prednisone. Medications helped decrease his back pain a little bit.  Able to get 2.5 hours of sleep at a time prior to his pain waking him  up. Used to be able to bend and twist to make it feel better.  Pt also states that his L leg would tingle > R.  Walking 25 yards feels like walking 10 miles. Legs, hips, and the top of his tail bone would burn like he was working out.  Not being able to do anything makes him increase weight. His physical activities are limited outside of working.  Pt currently is on his feet running machines.   Hips also feel like its rubbing when he walks.  Back also went out around 1999. Went to a [place] which made his back feel better.  He was told that one leg is shorter than the other.   Denies loss of bowel or bladder control.  Denies saddle anesthesia.    Back pain has gradually gotten worse since onset 2 years ago.     Patient Stated Goals  Be able to bend and not hurt. Be able to sleep for 7 hours.     Currently in Pain?  No/denies    Pain Score  0-No pain    Pain Onset  More than a month ago                               PT  Education - 09/21/18 1741    Education Details  ther-ex    Person(s) Educated  Patient    Methods  Explanation;Demonstration;Tactile cues;Verbal cues    Comprehension  Returned demonstration;Verbalized understanding      Objective   Pt states blood pressure is controlled.   No latex band allergies   MedbridgeAccess Code: [facility]   Therapeutic exercise  Seated physioball rolls   To the L 10x5 seconds for 2 sets  Standing bilateral shoulder extension with scapular retraction at [facility] machine plate 15 for 16X0 seconds to promote trunk strength and thoracic extension  Then plate 20 for 96E4 seconds   Standing straight pallof press at [employer] machine plate 15  54U9 seconds   Then plate 20 for 81X9 seconds   OMEGA rows resisting plate 25 for 14N8 seconds for 2 sets  Standing hip machine height 5               Hip extension                          R plate 85 for 29F6                         L plate 85 for  21H0   Side stepping resisting red band around ankles 32 ft to the L and 32 ft to the R to promote glute med muscle strengthening.    Improved exercise technique, movement at target joints, use of target muscles after mod verbal, visual, tactile cues.    Pt demonstrates decreasing L LE symptoms based on subjective reports. Slight R low back discomfort which eased off per subjective. Continued working on thoracic extension, glute max and med strengthening and trunk strengthening to help decrease extension pressure to low back. Pt tolerated session well without aggravation of symptoms. Pt will benefit from continued skilled physical therapy services to decrease pain, improve strength and function.        PT Short Term Goals - 09/07/18 1913      PT SHORT TERM GOAL #1   Title  Patient will be independent with his HEP to decrease pain, improve strength and function.     Baseline  Pt has started his HEP. 09/07/2018    Time  3    Period  Weeks    Status  New    Target Date  10/01/18        PT Long Term Goals - 09/07/18 1914      PT LONG TERM GOAL #1   Title  Patient will have a decrease in back pain to 4/10 or less at worst to promote ability to ambulate longer distances, perform standing tasks, improve ability to sleep.     Baseline  8.5/10 back pain at worst for the past 3 months (09/07/2018)    Time  6    Period  Weeks    Status  New    Target Date  10/22/18      PT LONG TERM GOAL #2   Title  Pt will improve B hip strength by at least 1/2 MMT grade to promote ability to perform standing tasks with less back and LE pain.     Time  6    Period  Weeks    Status  New    Target Date  10/22/18      PT LONG TERM GOAL #3   Title  Patient will report being able to  sleep at least 5 hours straight without his back pain waking him up to promote ability to rest.     Baseline  Pt able to sleep 2.5 hours at a time due to back pain waking him up (09/07/2018)    Time  6    Period  Weeks     Status  New    Target Date  10/22/18      PT LONG TERM GOAL #4   Title  Patient will improve his lumbar spine FOTO score by at least 10 points as a demonstration of improved function.     Baseline  FOTO 54 (09/07/2018)    Time  6    Period  Weeks    Status  New    Target Date  10/22/18            Plan - 09/21/18 1734    Clinical Impression Statement  Pt demonstrates decreasing L LE symptoms based on subjective reports. Slight R low back discomfort which eased off per subjective. Continued working on thoracic extension, glute max and med strengthening and trunk strengthening to help decrease extension pressure to low back. Pt tolerated session well without aggravation of symptoms. Pt will benefit from continued skilled physical therapy services to decrease pain, improve strength and function.     Rehab Potential  Fair    Clinical Impairments Affecting Rehab Potential  Chronicity of condition    PT Frequency  2x / week    PT Duration  6 weeks    PT Treatment/Interventions  Therapeutic exercise;Neuromuscular re-education;Therapeutic activities;Patient/family education;Manual techniques;Aquatic Therapy;Electrical Stimulation;Iontophoresis 4mg /ml Dexamethasone;Traction;Ultrasound    PT Next Visit Plan  thoracic, hip extension, glute and trunk strengthening, manual techniques, modalities PRN    Consulted and Agree with Plan of Care  Patient       Patient will benefit from skilled therapeutic intervention in order to improve the following deficits and impairments:  Pain, Improper body mechanics, Postural dysfunction, Decreased strength, Difficulty walking  Visit Diagnosis: Chronic bilateral low back pain, unspecified whether sciatica present  Radiculopathy, lumbosacral region  Muscle weakness (generalized)     Problem List There are no active problems to display for this patient.   Loralyn Freshwater PT, DPT   09/21/2018, 6:26 PM  [place] [facility]  PHYSICAL AND SPORTS MEDICINE [address] [place], [address] Phone: [phone]   Fax:  [phone]  Name: Jason Dickson MRN: [id number] Date of Birth: 05/23/1961

## 2018-09-23 ENCOUNTER — Ambulatory Visit: Payer: BLUE CROSS/BLUE SHIELD

## 2018-09-23 DIAGNOSIS — M5417 Radiculopathy, lumbosacral region: Secondary | ICD-10-CM

## 2018-09-23 DIAGNOSIS — M545 Low back pain: Secondary | ICD-10-CM | POA: Diagnosis not present

## 2018-09-23 DIAGNOSIS — M6281 Muscle weakness (generalized): Secondary | ICD-10-CM

## 2018-09-23 DIAGNOSIS — G8929 Other chronic pain: Secondary | ICD-10-CM

## 2018-09-23 NOTE — Patient Instructions (Signed)
  Side stepping resisting red band around ankles 32 ft to the L and 32 ft to the R to promote glute med muscle strengthening.     Reviewed and given as part of his HEP. Pt demonstrated and verbalized understanding.

## 2018-09-23 NOTE — Therapy (Signed)
[place] [facility]OR[facility] MEDICINE [address], [place], [address] Phone: [phone]   Fax:  [phone]  Physical Therapy Treatment  Patient Details  Name: Jason Dickson MRN: [id number] Date of Birth: [birth year] Referring Provider (PT): Marisue Ivan, MD   Encounter Date: 09/23/2018  PT End of Session - 09/23/18 1725    Visit Number  6    Number of Visits  13    Date for PT Re-Evaluation  10/22/18    PT Start Time  1725    PT Stop Time  1807    PT Time Calculation (min)  42 min    Activity Tolerance  Patient tolerated treatment well    Behavior During Therapy  [facility] for tasks assessed/performed       Past Medical History:  Diagnosis Date  . Allergy    Information from [facility]  . Arthritis    Information from [facility]  . GERD (gastroesophageal reflux disease)    Information from [facility]  . Hypercholesterolemia    Information from [facility]  . Hypertension    Information from [facility]    Past Surgical History:  Procedure Laterality Date  . ORIF METACARPAL FRACTURE Right 03/02/2013   Information from [facility]; 5th metacarpal    There were no vitals filed for this visit.  Subjective Assessment - 09/23/18 1725    Subjective  L LE tingling today. [place] was decent. Just has a little numbness L lateral thigh. 5/10 back pain at most for the past 7 days.     Pertinent History  Chronic lumbar radiculopathy. Pt has had back pain for 2 years, gradual onset. Currently unable to lie flat on his back or L side. Able to lie on his R side. Today is also his last day taking prednisone. Medications helped decrease his back pain a little bit.  Able to get 2.5 hours of sleep at a time prior to his pain waking him up. Used to be able to bend and twist to make it feel better.  Pt also states that his L leg  would tingle > R.  Walking 25 yards feels like walking 10 miles. Legs, hips, and the top of his tail bone would burn like he was working out.  Not being able to do anything makes him increase weight. His physical activities are limited outside of working.  Pt currently is on his feet running machines.   Hips also feel like its rubbing when he walks.  Back also went out around 1999. [place] to [place] which made his back feel better.  He was told that one leg is shorter than the other.   Denies loss of bowel or bladder control.  Denies saddle anesthesia.    Back pain has gradually gotten worse since onset 2 years ago.     Patient Stated Goals  Be able to bend and not hurt. Be able to sleep for 7 hours.     Currently in Pain?  No/denies    Pain Score  0-No pain    Pain Onset  More than a month ago                               PT Education - 09/23/18 1736    Education Details  ther-ex, HEP    Person(s) Educated  Patient  Methods  Explanation;Demonstration;Tactile cues;Verbal cues    Comprehension  Returned demonstration;Verbalized understanding       Objective   Pt states blood pressure is controlled.   No latex band allergies   MedbridgeAccess Code: [facility]   Therapeutic exercise  Standing and propping L foot onto regular step. No L lateral thigh numbness afterwards.  Side stepping resisting red band around ankles 32 ft to the L and 32 ft to the R to promote glute med muscle strengthening.   Reviewed and given as part of his HEP. Pt demonstrated and verbalized understanding.    OMEGA rows resisting plate 25 for 95A2 seconds for 3 sets  Standing hip machine height 5  Hip extension  R plate 85 for 13Y8 L plate 85 for 65H8  Standing B shoulder extension resisting blue band 10x5 seconds for 3 sets with scapular retraction  Standing straight pallof press resisting double blue  band 10x5 seconds   Seated open books 10x5 seconds to promote thoracic extension for 2 sets   Improved exercise technique, movement at target joints, use of target muscles after mod verbal, visual, tactile cues.     Pt making progress with decreased back pain based on subjective reports with 5/10 back pain at worst for the past 7 days compared to his original 8.5/10. Continued working on thoracic extension, trunk, and hip strengthening to help decrease extension pressure to low back. Pt tolerated session well without aggravation of symptoms. Pt will benefit from continued skilled physical therapy services to decrease back pain, improve strength, and function.                  PT Short Term Goals - 09/07/18 1913      PT SHORT TERM GOAL #1   Title  Patient will be independent with his HEP to decrease pain, improve strength and function.     Baseline  Pt has started his HEP. 09/07/2018    Time  3    Period  Weeks    Status  New    Target Date  10/01/18        PT Long Term Goals - 09/07/18 1914      PT LONG TERM GOAL #1   Title  Patient will have a decrease in back pain to 4/10 or less at worst to promote ability to ambulate longer distances, perform standing tasks, improve ability to sleep.     Baseline  8.5/10 back pain at worst for the past 3 months (09/07/2018)    Time  6    Period  Weeks    Status  New    Target Date  10/22/18      PT LONG TERM GOAL #2   Title  Pt will improve B hip strength by at least 1/2 MMT grade to promote ability to perform standing tasks with less back and LE pain.     Time  6    Period  Weeks    Status  New    Target Date  10/22/18      PT LONG TERM GOAL #3   Title  Patient will report being able to sleep at least 5 hours straight without his back pain waking him up to promote ability to rest.     Baseline  Pt able to sleep 2.5 hours at a time due to back pain waking him up (09/07/2018)    Time  6    Period  Weeks    Status  New  Target Date  10/22/18      PT LONG TERM GOAL #4   Title  Patient will improve his lumbar spine FOTO score by at least 10 points as a demonstration of improved function.     Baseline  FOTO 54 (09/07/2018)    Time  6    Period  Weeks    Status  New    Target Date  10/22/18            Plan - 09/23/18 1737    Clinical Impression Statement  Pt making progress with decreased back pain based on subjective reports with 5/10 back pain at worst for the past 7 days compared to his original 8.5/10. Continued working on thoracic extension, trunk, and hip strengthening to help decrease extension pressure to low back. Pt tolerated session well without aggravation of symptoms. Pt will benefit from continued skilled physical therapy services to decrease back pain, improve strength, and function.     Rehab Potential  Fair    Clinical Impairments Affecting Rehab Potential  Chronicity of condition    PT Frequency  2x / week    PT Duration  6 weeks    PT Treatment/Interventions  Therapeutic exercise;Neuromuscular re-education;Therapeutic activities;Patient/family education;Manual techniques;Aquatic Therapy;Electrical Stimulation;Iontophoresis 4mg /ml Dexamethasone;Traction;Ultrasound    PT Next Visit Plan  thoracic, hip extension, glute and trunk strengthening, manual techniques, modalities PRN    Consulted and Agree with Plan of Care  Patient       Patient will benefit from skilled therapeutic intervention in order to improve the following deficits and impairments:  Pain, Improper body mechanics, Postural dysfunction, Decreased strength, Difficulty walking  Visit Diagnosis: Chronic bilateral low back pain, unspecified whether sciatica present  Radiculopathy, lumbosacral region  Muscle weakness (generalized)     Problem List There are no active problems to display for this patient.   Loralyn FreshwaterMiguel Trevian Hayashida PT, DPT   09/23/2018, 6:22 PM  [facility]  [facility] [address] [place], [place], [address] Phone: [phone]   Fax:  [phone]  Name: Jason Dickson MRN: [id number] Date of Birth: 11/24/1960

## 2018-09-28 ENCOUNTER — Ambulatory Visit: Payer: Managed Care, Other (non HMO) | Attending: Family Medicine

## 2018-09-28 DIAGNOSIS — M6281 Muscle weakness (generalized): Secondary | ICD-10-CM

## 2018-09-28 DIAGNOSIS — G8929 Other chronic pain: Secondary | ICD-10-CM | POA: Insufficient documentation

## 2018-09-28 DIAGNOSIS — M5417 Radiculopathy, lumbosacral region: Secondary | ICD-10-CM | POA: Diagnosis present

## 2018-09-28 DIAGNOSIS — M545 Low back pain, unspecified: Secondary | ICD-10-CM

## 2018-09-28 NOTE — Patient Instructions (Signed)
  MedbridgeAccess Code: [email or web address]  Lower Trunk Rotations  10x3

## 2018-09-28 NOTE — Therapy (Signed)
[facility] [address] , [place], [address] Phone: [phone]   Fax:  [phone]  Physical Therapy Treatment  Patient Details  Name: Jason AcresJeffrey A Huether MRN: [id number] Date of Birth: 08/19/1961 Referring Provider (PT): Marisue IvanKanhka Linthavong, MD   Encounter Date: 09/28/2018  PT End of Session - 09/28/18 1727    Visit Number  7    Number of Visits  13    Date for PT Re-Evaluation  10/22/18    PT Start Time  1727    PT Stop Time  1810    PT Time Calculation (min)  43 min    Activity Tolerance  Patient tolerated treatment well    Behavior During Therapy  [facility] for tasks assessed/performed       Past Medical History:  Diagnosis Date  . Allergy    [facility]  . Arthritis    [facility]  . GERD (gastroesophageal reflux disease)    [facility]  . Hypercholesterolemia    [facility]  . Hypertension    [facility]    Past Surgical History:  Procedure Laterality Date  . ORIF METACARPAL FRACTURE Right 03/02/2013   [facility]; [facility]th [facility]arp[facility]    There were no vitals filed for this visit.  Subjective Assessment - 09/28/18 1727    Subjective  Back did great until going back to work. L LE started tingling again around 12:30 pm. Goes to work around 6 am. Gets off at 5 pm.  No back pain currently.  Just a little tingling L lateral thigh.  Still has a hard time sleeping.  One night he was able to sleep about 8.5 hours straight a couple of weeks ago.  Able to sleep about 3.5 hours straight average.     Pertinent History  Chronic lumbar radiculopathy. Pt has had back pain for 2 years, gradual onset. Currently unable to lie flat on his back or L side. Able to lie on his R side. Today is also his last day taking  prednisone. Medications helped decrease his back pain a little bit.  Able to get 2.5 hours of sleep at a time prior to his pain waking him up. Used to be able to bend and twist to make it feel better.  Pt also states that his L leg would tingle > R.  Walking 25 yards feels like walking 10 miles. Legs, hips, and the top of his tail bone would burn like he was working out.  Not being able to do anything makes him increase weight. His physical activities are limited outside of working.  Pt currently is on his feet running machines.   Hips also feel like its rubbing when he walks.  Back also went out around 1999. [place] his back feel better.  He was told that one leg is shorter than the other.   Denies loss of bowel or bladder control.  Denies saddle anesthesia.    Back pain has gradually gotten worse since onset 2 years ago.     Patient Stated Goals  Be able to bend and not hurt. Be able to sleep for 7 hours.     Currently in Pain?  No/denies    Pain Onset  More than a month ago  PT Education - 09/28/18 1737    Education Details  ther-ex    Person(s) Educated  Patient    Methods  Explanation;Demonstration;Tactile cues;Verbal cues    Comprehension  Returned demonstration;Verbalized understanding      Objective   Pt states blood pressure is controlled.   No latex band allergies   MedbridgeAccess Code: [facility]  Next MD appointment is on 10/09/2018  Therapeutic exercise   Standing B shoulder extension resisting blue band 10x5 seconds for 3 sets with scapular retraction  Seated thoracic extension over chair 10x5 seconds, feet propped on 4 inch step and Air Ex pad  Standing hip machine height 5  Hip extension  R plate 85 for 16X0 L plate 85 for 96E4   OMEGA rows resisting plate [id number] 11B1 seconds for 3 sets  Seated trunk rotation 1x5  seconds each side. Lateral trunk cramps which eases with rest  Reclined lower trunk rotation 10x3 each side  Felt good for back per pt.   Static mini lunge onto Air Ex pad with L LE and one UE assist    10x  Improved exercise technique, movement at target joints, use of target muscles after min to mod verbal, visual, tactile cues.    Pt making some progress with increased sleep based on subjective reports. Continued working on thoracic extension, trunk mobility, trunk and glute strengthening to help decrease stress to low back. Pt will benefit from continued skilled physical therapy services to decrease pain, improve sleep and function.     PT Short Term Goals - 09/07/18 1913      PT SHORT TERM GOAL #1   Title  Patient will be independent with his HEP to decrease pain, improve strength and function.     Baseline  Pt has started his HEP. 09/07/2018    Time  3    Period  Weeks    Status  New    Target Date  10/01/18        PT Long Term Goals - 09/07/18 1914      PT LONG TERM GOAL #1   Title  Patient will have a decrease in back pain to 4/10 or less at worst to promote ability to ambulate longer distances, perform standing tasks, improve ability to sleep.     Baseline  8.5/10 back pain at worst for the past 3 months (09/07/2018)    Time  6    Period  Weeks    Status  New    Target Date  10/22/18      PT LONG TERM GOAL #2   Title  Pt will improve B hip strength by at least 1/2 MMT grade to promote ability to perform standing tasks with less back and LE pain.     Time  6    Period  Weeks    Status  New    Target Date  10/22/18      PT LONG TERM GOAL #3   Title  Patient will report being able to sleep at least 5 hours straight without his back pain waking him up to promote ability to rest.     Baseline  Pt able to sleep 2.5 hours at a time due to back pain waking him up (09/07/2018)    Time  6    Period  Weeks    Status  New    Target Date  10/22/18      PT LONG TERM  GOAL #4   Title  Patient will improve his  lumbar spine FOTO score by at least 10 points as a demonstration of improved function.     Baseline  FOTO 54 (09/07/2018)    Time  6    Period  Weeks    Status  New    Target Date  10/22/18            Plan - 09/28/18 1738    Clinical Impression Statement  Pt making some progress with increased sleep based on subjective reports. Continued working on thoracic extension, trunk mobility, trunk and glute strengthening to help decrease stress to low back. Pt will benefit from continued skilled physical therapy services to decrease pain, improve sleep and function.     Rehab Potential  Fair    Clinical Impairments Affecting Rehab Potential  Chronicity of condition    PT Frequency  2x / week    PT Duration  6 weeks    PT Treatment/Interventions  Therapeutic exercise;Neuromuscular re-education;Therapeutic activities;Patient/family education;Manual techniques;Aquatic Therapy;Electrical Stimulation;Iontophoresis 4mg /ml Dexamethasone;Traction;Ultrasound    PT Next Visit Plan  thoracic, hip extension, glute and trunk strengthening, manual techniques, modalities PRN    Consulted and Agree with Plan of Care  Patient       Patient will benefit from skilled therapeutic intervention in order to improve the following deficits and impairments:  Pain, Improper body mechanics, Postural dysfunction, Decreased strength, Difficulty walking  Visit Diagnosis: Chronic bilateral low back pain, unspecified whether sciatica present  Radiculopathy, lumbosacral region  Muscle weakness (generalized)     Problem List There are no active problems to display for this patient.   Loralyn Freshwater PT, DPT   09/28/2018, 6:22 PM  [facility] PHYSICAL AND SPORTS MEDICINE [address] [place], [address] Phone: [phone]   Fax:  [phone]  Name: NAREK KNISS MRN: [id number] Date of Birth: Jul 06, 1961

## 2018-09-30 ENCOUNTER — Ambulatory Visit: Payer: Managed Care, Other (non HMO)

## 2018-09-30 DIAGNOSIS — M6281 Muscle weakness (generalized): Secondary | ICD-10-CM

## 2018-09-30 DIAGNOSIS — M545 Low back pain: Secondary | ICD-10-CM | POA: Diagnosis not present

## 2018-09-30 DIAGNOSIS — M5417 Radiculopathy, lumbosacral region: Secondary | ICD-10-CM

## 2018-09-30 DIAGNOSIS — G8929 Other chronic pain: Secondary | ICD-10-CM

## 2018-09-30 NOTE — Therapy (Signed)
[place] [facility] PH[facility]OR[facility] MEDICINE [address] [place], [address] Phone: [phone]   Fax:  [phone]  Physical Therapy Treatment  Patient Details  Name: Jason Dickson MRN: [id number] Date of Birth: 10-15-1961 Referring Provider (PT): Marisue Ivan, MD   Encounter Date: 09/30/2018  PT End of Session - 09/30/18 1720    Visit Number  8    Number of Visits  13    Date for PT Re-Evaluation  10/22/18    PT Start Time  1722    PT Stop Time  1804    PT Time Calculation (min)  42 min    Activity Tolerance  Patient tolerated treatment well    Behavior During Therapy  [facility] for tasks assessed/performed       Past Medical History:  Diagnosis Date  . Allergy    Information from [facility]  . Arthritis    Information from [facility]  . GERD (gastroesophageal reflux disease)    Information from [facility]  . Hypercholesterolemia    Information from [facility]  . Hypertension    Information from [facility]    Past Surgical History:  Procedure Laterality Date  . ORIF METACARPAL FRACTURE Right 03/02/2013   Information from [facility]; 5th metacarpal    There were no vitals filed for this visit.  Subjective Assessment - 09/30/18 1723    Subjective  A little bit of tingling now and again. Back does not hurt. Was sore yesterday. The area between the shoulder blades were sore after doing the seated open book exercise.   Able to sleep 4.5 hours last night.  Back pain did not wake him up Monday night. Woke up to get to the bathroom.  Was also able to sit down and check parts at work to give his back a break.     Pertinent History  Chronic lumbar radiculopathy. Pt has had back pain for 2 years, gradual onset. Currently unable to lie flat on his back or L side. Able to lie on his R side. Today is also his last day taking  prednisone. Medications helped decrease his back pain a little bit.  Able to get 2.5 hours of sleep at a time prior to his pain waking him up. Used to be able to bend and twist to make it feel better.  Pt also states that his L leg would tingle > R.  Walking 25 yards feels like walking 10 miles. Legs, hips, and the top of his tail bone would burn like he was working out.  Not being able to do anything makes him increase weight. His physical activities are limited outside of working.  Pt currently is on his feet running machines.   Hips also feel like its rubbing when he walks.  Back also went out around 1999. [place] which made his back feel better.  He was told that one leg is shorter than the other.   Denies loss of bowel or bladder control.  Denies saddle anesthesia.    Back pain has gradually gotten worse since onset 2 years ago.     Patient Stated Goals  Be able to bend and not hurt. Be able to sleep for 7 hours.     Currently in Pain?  No/denies    Pain Onset  More than a month ago  PT Education - 09/30/18 1733    Education Details  ther-ex    Person(s) Educated  Patient    Methods  Explanation;Demonstration;Tactile cues;Verbal cues    Comprehension  Returned demonstration;Verbalized understanding      Objective   Pt states blood pressure is controlled.   No latex band allergies   Med[facility]Access [email or web address]  Next MD appointment is on 10/09/2018  Therapeutic exercise  Pt was recommended to use an ice pack for his low back after work for 15 min with a cloth barrier to help decrease irritation in his back from work. Pt verbalized understanding.   Reclined lower trunk rotation 10x2 each side  Then in supine 10x each side             Felt good for back per pt.   hooklying open books 10x2 to promote thoracic extension   hooklying naval ins: lower abdominal cramping  Seated naval ins: lower  abdominal cramping Standing naval ins: 5x5 seconds no cramping.  Standing B shoulder extension resisting blue band 10x5 seconds for 3 sets with scapular retraction  Standing hip machine height 5  Hip extension  R plate 161100 for 09U010x3 L plate 454100 for 09W110x3   OMEGA rows resisting plate [id number] [id number] seconds, then 5x5 seconds    Static mini lunge onto Air Ex pad with L LE and one UE assist              10x  Then onto dyna disc10x  Improved exercise technique, movement at target joints, use of target muscles after min to mod verbal, visual, tactile cues.      Improving length of time pt able to sleep at night based on subjective reports with pt able to sleep 4.5 hours straight one day this week. Pt was also recommended to use an ice pack for his back after returning home from work to help decrease irritation from prolonged standing. Continued working on low back and thoracic spine mobility, thoracic extension, and glute strengthening to help continue to decrease extension pressure to his low back. Pt tolerated session well without complain of pain. Pt will benefit from continued skilled physical therapy services to decrease pain and return to prior level of function.             PT Short Term Goals - 09/07/18 1913      PT SHORT TERM GOAL #1   Title  Patient will be independent with his HEP to decrease pain, improve strength and function.     Baseline  Pt has started his HEP. 09/07/2018    Time  3    Period  Weeks    Status  New    Target Date  10/01/18        PT Long Term Goals - 09/07/18 1914      PT LONG TERM GOAL #1   Title  Patient will have a decrease in back pain to 4/10 or less at worst to promote ability to ambulate longer distances, perform standing tasks, improve ability to sleep.     Baseline  8.5/10 back pain at worst for the past 3 months (09/07/2018)    Time  6    Period  Weeks    Status  New     Target Date  10/22/18      PT LONG TERM GOAL #2   Title  Pt will improve B hip strength by at least 1/2 MMT grade to promote ability to perform standing tasks with less  back and LE pain.     Time  6    Period  Weeks    Status  New    Target Date  10/22/18      PT LONG TERM GOAL #3   Title  Patient will report being able to sleep at least 5 hours straight without his back pain waking him up to promote ability to rest.     Baseline  Pt able to sleep 2.5 hours at a time due to back pain waking him up (09/07/2018)    Time  6    Period  Weeks    Status  New    Target Date  10/22/18      PT LONG TERM GOAL #4   Title  Patient will improve his lumbar spine FOTO score by at least 10 points as a demonstration of improved function.     Baseline  FOTO 54 (09/07/2018)    Time  6    Period  Weeks    Status  New    Target Date  10/22/18            Plan - 09/30/18 1717    Clinical Impression Statement  Improving length of time pt able to sleep at night based on subjective reports with pt able to sleep 4.5 hours straight one day this week. Pt was also recommended to use an ice pack for his back after returning home from work to help decrease irritation from prolonged standing. Continued working on low back and thoracic spine mobility, thoracic extension, and glute strengthening to help continue to decrease extension pressure to his low back. Pt tolerated session well without complain of pain. Pt will benefit from continued skilled physical therapy services to decrease pain and return to prior level of function.     Rehab Potential  Fair    Clinical Impairments Affecting Rehab Potential  Chronicity of condition    PT Frequency  2x / week    PT Duration  6 weeks    PT Treatment/Interventions  Therapeutic exercise;Neuromuscular re-education;Therapeutic activities;Patient/family education;Manual techniques;Aquatic Therapy;Electrical Stimulation;Iontophoresis 4mg /ml  Dexamethasone;Traction;Ultrasound    PT Next Visit Plan  thoracic, hip extension, glute and trunk strengthening, manual techniques, modalities PRN    Consulted and Agree with Plan of Care  Patient       Patient will benefit from skilled therapeutic intervention in order to improve the following deficits and impairments:  Pain, Improper body mechanics, Postural dysfunction, Decreased strength, Difficulty walking  Visit Diagnosis: Chronic bilateral low back pain, unspecified whether sciatica present  Radiculopathy, lumbosacral region  Muscle weakness (generalized)     Problem List There are no active problems to display for this patient.   Loralyn Freshwater PT, DPT   09/30/2018, 6:14 PM  [place] [facility] PHYSICAL AND SPORTS MEDICINE [address] [place], [address] Phone: [phone]   Fax:  [phone]  Name: Jason Dickson MRN: [id number] Date of Birth: 1960/12/13

## 2018-10-05 ENCOUNTER — Ambulatory Visit: Payer: Managed Care, Other (non HMO)

## 2018-10-05 DIAGNOSIS — M6281 Muscle weakness (generalized): Secondary | ICD-10-CM

## 2018-10-05 DIAGNOSIS — M545 Low back pain: Principal | ICD-10-CM

## 2018-10-05 DIAGNOSIS — G8929 Other chronic pain: Secondary | ICD-10-CM

## 2018-10-05 DIAGNOSIS — M5417 Radiculopathy, lumbosacral region: Secondary | ICD-10-CM

## 2018-10-05 NOTE — Therapy (Addendum)
[place] PHYSICAL AND SPORTS MEDICINE [address] [place], [address] Phone: [phone]   Fax:  [phone]  Physical Therapy Treatment  Patient Details  Name: Jason Dickson MRN: [id number] Date of Birth: [birth year] Referring Provider (PT): Dion Body, MD   Encounter Date: 10/05/2018  PT End of Session - 10/05/18 1715    Visit Number  9    Number of Visits  13    Date for PT Re-Evaluation  10/22/18    PT Start Time  1716    PT Stop Time  1759    PT Time Calculation (min)  43 min    Activity Tolerance  Patient tolerated treatment well    Behavior During Therapy  [facility] for tasks assessed/performed       Past Medical History:  Diagnosis Date  . Allergy    Information from [place]  . Arthritis    Information from [facility]  . GERD (gastroesophageal reflux disease)    Information from [facility]  . Hypercholesterolemia    Information from [facility]  . Hypertension    Information from [facility]    Past Surgical History:  Procedure Laterality Date  . ORIF METACARPAL FRACTURE Right 03/02/2013   Information from [place]; 5th metacarpal    There were no vitals filed for this visit.  Subjective Assessment - 10/05/18 1716    Subjective  Did not do his HEP this weekend to see how he does. Had no leg tingling. Had mild pain in back. Had R groin pain Friday initial 3 steps. Was fine after walking more.   R low back started bothering him yeterday afternoon. Might have been from watching football games too long.  No back pain currently. L leg has not tingled.  Back did not wake him but but when he does wake up, he feels back pain. Before, his back would literally wake him up.  Denies loss of bowel or bladder control, denies saddle anesthesia.   The R hip pain felt better after he moved. Has not bothered him  since Friday.   3/10 back pain at most for the past 7 days.  Able to sleep betwen 3.5 to 5.5 hours straight on average.     Pertinent History  Chronic lumbar radiculopathy. Pt has had back pain for 2 years, gradual onset. Currently unable to lie flat on his back or L side. Able to lie on his R side. Today is also his last day taking prednisone. Medications helped decrease his back pain a little bit.  Able to get 2.5 hours of sleep at a time prior to his pain waking him up. Used to be able to bend and twist to make it feel better.  Pt also states that his L leg would tingle > R.  Walking 25 yards feels like walking 10 miles. Legs, hips, and the top of his tail bone would burn like he was working out.  Not being able to do anything makes him increase weight. His physical activities are limited outside of working.  Pt currently is on his feet running machines.   Hips also feel like its rubbing when he walks.  Back also went out around 1999. 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  He was told that one leg is shorter than the other.   Denies loss of bowel or bladder control.  Denies saddle  anesthesia.    Back pain has gradually gotten worse since onset 2 years ago.     Patient Stated Goals  Be able to bend and not hurt. Be able to sleep for 7 hours.     Currently in Pain?  No/denies    Pain Score  0-No pain    Pain Onset  More than a month ago         [facility] PT Assessment - 10/05/18 1721      Strength   Right Hip Flexion  5/5    Right Hip Extension  5/5   seated manually resisted   Right Hip ABduction  5/5   seated manually resisted clamshell   Left Hip Flexion  5/5    Left Hip Extension  5/5   seated manually resisted   Left Hip ABduction  5/5   seated manually resisted clamshell                          PT Education - 10/05/18 1725    Education Details  ther-ex    Person(s) Educated  Patient    Methods  Explanation;Demonstration;Tactile cues;Verbal cues     Comprehension  Returned demonstration;Verbalized understanding        Objective   Pt states blood pressure is controlled.   No latex band allergies   MedbridgeAccess Code: [facility]  Next MD appointment is on 10/09/2018  Pt states he wants to continue at least one more week after this week with PT.   Therapeutic exercise  Review HEP and re-print out exercises next visit if needed.   Seated manually resisted hip flexion, hip extension, clamshell 1x each way for each LE  Supine lower trunk rotation 10x2 each side  Supine open books 10x5 seconds for 2 sets   Standing hip machine height 5  Hip extension  R plate 100 for 96E9 L plate 100 for 52W4  OMEGA rows resisting plate 64fr [id number] for 2 sets   Static mini lunge onto dyna disc with L LE and one UE assist  10x2  Running man for L LE with one UE assist 6x. Difficult.    Standing B shoulder extension resisting blue and yellow band 10x5 seconds with scapular retraction  Standing straight pallof press resisting double blue band 10x5 seconds                  Improved exercise technique, movement at target joints, use of target muscles after min to mod verbal, visual, tactile cues.    Pt demonstrates improving ability to sleep for longer periods, decreased back and L LE pain, and improved B hip strength since initial evaluation. Pt making good progress with PT towards goals. Pt still demonstrates low back pain and LE symptoms, and difficulty sleeping at times (but better), and would benefit from continued skilled physical therapy services to address the aforementioned deficits.              PT Short Term Goals - 10/05/18 1730      PT SHORT TERM GOAL #1   Title  Patient will be independent with his HEP to decrease pain, improve strength and function.     Baseline  Pt has started his HEP. 09/07/2018; overall  performing his HEP except for this past weekend (10/05/2018)    Time  3    Period  Weeks    Status  Partially Met    Target Date  10/01/18  PT Long Term Goals - 10/05/18 1731      PT LONG TERM GOAL #1   Title  Patient will have a decrease in back pain to 4/10 or less at worst to promote ability to ambulate longer distances, perform standing tasks, improve ability to sleep.     Baseline  8.5/10 back pain at worst for the past 3 months (09/07/2018); 3/10 at worst for the past 7 days (10/05/2018)    Time  6    Period  Weeks    Status  Achieved    Target Date  10/22/18      PT LONG TERM GOAL #2   Title  Pt will improve B hip strength by at least 1/2 MMT grade to promote ability to perform standing tasks with less back and LE pain.     Time  6    Period  Weeks    Status  Achieved    Target Date  10/22/18      PT LONG TERM GOAL #3   Title  Patient will report being able to sleep at least 5 hours straight without his back pain waking him up to promote ability to rest.     Baseline  Pt able to sleep 2.5 hours at a time due to back pain waking him up (09/07/2018); 3.5 to 5.5 hours (10/05/2018)    Time  6    Period  Weeks    Status  Partially Met    Target Date  10/22/18      PT LONG TERM GOAL #4   Title  Patient will improve his lumbar spine FOTO score by at least 10 points as a demonstration of improved function.     Baseline  FOTO 54 (09/07/2018)    Time  6    Period  Weeks    Status  On-going    Target Date  10/22/18            Plan - 10/05/18 1712    Clinical Impression Statement  Pt demonstrates improving ability to sleep for longer periods, decreased back and L LE pain, and improved B hip strength since initial evaluation. Pt making good progress with PT towards goals. Pt still demonstrates low back pain and LE symptoms, and difficulty sleeping at times (but better), and would benefit from continued skilled physical therapy services to address the aforementioned  deficits.      History and Personal Factors relevant to plan of care:  Chronicity of condition, difficulty walking longer distances, standing and laying on hsi back or side for long periods due to pain, leg length difference.     Clinical Presentation  Stable    Clinical Presentation due to:  Patient making progress with PT towards goals     Clinical Decision Making  Low    Rehab Potential  Fair    Clinical Impairments Affecting Rehab Potential  Chronicity of condition    PT Frequency  2x / week    PT Duration  6 weeks    PT Treatment/Interventions  Therapeutic exercise;Neuromuscular re-education;Therapeutic activities;Patient/family education;Manual techniques;Aquatic Therapy;Electrical Stimulation;Iontophoresis 90m/ml Dexamethasone;Traction;Ultrasound    PT Next Visit Plan  thoracic, hip extension, glute and trunk strengthening, manual techniques, modalities PRN    Consulted and Agree with Plan of Care  Patient       Patient will benefit from skilled therapeutic intervention in order to improve the following deficits and impairments:  Pain, Improper body mechanics, Postural dysfunction, Decreased strength, Difficulty walking  Visit Diagnosis: Chronic bilateral  low back pain, unspecified whether sciatica present  Radiculopathy, lumbosacral region  Muscle weakness (generalized)     Problem List There are no active problems to display for this patient.  Thank you for your referral.   Joneen Boers PT, DPT    10/05/2018, 6:14 PM  [place] PHYSICAL AND SPORTS MEDICINE [address] [place], [address] Phone: [phone]   Fax:  [phone]  Name: Jason Dickson MRN: [id number] Date of Birth: Dec 02, 1960

## 2018-10-07 ENCOUNTER — Ambulatory Visit: Payer: Managed Care, Other (non HMO)

## 2018-10-12 ENCOUNTER — Ambulatory Visit: Payer: Managed Care, Other (non HMO)

## 2018-10-12 DIAGNOSIS — M545 Low back pain: Principal | ICD-10-CM

## 2018-10-12 DIAGNOSIS — M5417 Radiculopathy, lumbosacral region: Secondary | ICD-10-CM

## 2018-10-12 DIAGNOSIS — M6281 Muscle weakness (generalized): Secondary | ICD-10-CM

## 2018-10-12 DIAGNOSIS — G8929 Other chronic pain: Secondary | ICD-10-CM

## 2018-10-12 NOTE — Patient Instructions (Addendum)
MedbridgeAccess Code: [email or web address]   Shoulder extension with resistance - Neutral   Blue and yellow band 10x5 seconds 3 sets  Straight pallof press resisting blue band 10x10 seconds 2 sets daily

## 2018-10-12 NOTE — Therapy (Signed)
[place] [facility] [address] [place], [address] Phone: [phone]   Fax:  [phone]  Physical Therapy Treatment  Patient Details  Name: Jason Dickson MRN: [id number] Date of Birth: 28-Sep-1961 Referring Provider (PT): Dion Body, MD   Encounter Date: 10/12/2018  PT End of Session - 10/12/18 1717    Visit Number  10    Number of Visits  13    Date for PT Re-Evaluation  10/22/18    PT Start Time  0263    PT Stop Time  1801    PT Time Calculation (min)  44 min    Activity Tolerance  Patient tolerated treatment well    Behavior During Therapy  [facility] for tasks assessed/performed       Past Medical History:  Diagnosis Date  . Allergy    Information from [place]  . Arthritis    Information from [facility]  . GERD (gastroesophageal reflux disease)    Information from [facility]  . Hypercholesterolemia    Information from [facility]  . Hypertension    Information from [place]    Past Surgical History:  Procedure Laterality Date  . ORIF METACARPAL FRACTURE Right 03/02/2013   Information from [place]; 5th metacarpal    There were no vitals filed for this visit.  Subjective Assessment - 10/12/18 1718    Subjective  Did great today until 3 pm. L lateral thigh to knee felt numb around that time. Also had a little bit of R low back pain. Did good Thursday, and Friday. Hurt a little bit on the R side Saturday.  Sunday morning felt R low back pain and L groin area pain.  The pain went away when he used a shopping cart at [place].  Did exercises yesterday (open book and seated trunk flexion to the L).  Was able to sleep from 9 pm to 4 am (7 hours). No pain when he woke up this morning.   Pt states that next visit might be a good time to graduate PT.     Pertinent History  Chronic lumbar  radiculopathy. Pt has had back pain for 2 years, gradual onset. Currently unable to lie flat on his back or L side. Able to lie on his R side. Today is also his last day taking prednisone. Medications helped decrease his back pain a little bit.  Able to get 2.5 hours of sleep at a time prior to his pain waking him up. Used to be able to bend and twist to make it feel better.  Pt also states that his L leg would tingle > R.  Walking 25 yards feels like walking 10 miles. Legs, hips, and the top of his tail bone would burn like he was working out.  Not being able to do anything makes him increase weight. His physical activities are limited outside of working.  Pt currently is on his feet running machines.   Hips also feel like its rubbing when he walks.  Back also went out around 1999. 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  He was told that one leg is shorter than the other.   Denies loss of bowel or bladder control.  Denies saddle anesthesia.    Back pain has gradually gotten worse since onset 2 years ago.     Patient Stated Goals  Be able  to bend and not hurt. Be able to sleep for 7 hours.     Currently in Pain?  No/denies    Pain Score  0-No pain    Pain Onset  More than a month ago         [facility] PT Assessment - 10/12/18 0001      Observation/Other Assessments   Focus on Therapeutic Outcomes (FOTO)   Lumbar Spine FOTO: 63                           PT Education - 10/12/18 1735    Education Details  ther-ex, HEP    Person(s) Educated  Patient    Methods  Explanation;Demonstration;Tactile cues;Verbal cues;Handout    Comprehension  Returned demonstration;Verbalized understanding        Objective   Pt states blood pressure is controlled.   No latex band allergies   MedbridgeAccess Code: [facility]    Therapeutic exercise   Standing B shoulder scaption at corner wall 10x2 to promote thoracic extension   OMEGA rows resisting plate70fr  10x5 seconds for 2 sets   Standing pallof press at Omega machine plate 15 for [id number]  Running man for L LE with one UE assist 10x to promote glute max muscle use  Give as part of HEP next visit if appropriate   Static mini lunge onto dyna disc with L LE and one UE assist  10x3  Standing B shoulder extension resisting blue and yellow band 10x5 seconds with scapular retraction for 3 sets    Standing hip machine height 5  Hip extension  R plate 1062f [id number] plate [id number] pallof press resisting double blue band 10x10 seconds   Improved exercise technique, movement at target joints, use of target muscles after min to mod verbal, visual, tactile cues.    Improving ability to sleep for longer periods with pt able to sleep for 7 hours yesterday. Pt also demonstrates improved ability to perform functional tasks based on his FOTO score. Pt making good progress with PT towards goals. Continued working on thoracic extension, trunk and glute strengthening to help decrease pressure to low back. Pt tolerated session well without aggravation of symptoms.      PT Short Term Goals - 10/05/18 1730      PT SHORT TERM GOAL #1   Title  Patient will be independent with his HEP to decrease pain, improve strength and function.     Baseline  Pt has started his HEP. 09/07/2018; overall performing his HEP except for this past weekend (10/05/2018)    Time  3    Period  Weeks    Status  Partially Met    Target Date  10/01/18        PT Long Term Goals - 10/05/18 1731      PT LONG TERM GOAL #1   Title  Patient will have a decrease in back pain to 4/10 or less at worst to promote ability to ambulate longer distances, perform standing tasks, improve ability to sleep.     Baseline  8.5/10 back pain at worst for the past 3 months (09/07/2018); 3/10 at worst for the past 7 days (10/05/2018)    Time  6     Period  Weeks    Status  Achieved    Target Date  10/22/18      PT LONG TERM GOAL #2   Title  Pt will improve B hip  strength by at least 1/2 MMT grade to promote ability to perform standing tasks with less back and LE pain.     Time  6    Period  Weeks    Status  Achieved    Target Date  10/22/18      PT LONG TERM GOAL #3   Title  Patient will report being able to sleep at least 5 hours straight without his back pain waking him up to promote ability to rest.     Baseline  Pt able to sleep 2.5 hours at a time due to back pain waking him up (09/07/2018); 3.5 to 5.5 hours (10/05/2018)    Time  6    Period  Weeks    Status  Partially Met    Target Date  10/22/18      PT LONG TERM GOAL #4   Title  Patient will improve his lumbar spine FOTO score by at least 10 points as a demonstration of improved function.     Baseline  FOTO 54 (09/07/2018)    Time  6    Period  Weeks    Status  On-going    Target Date  10/22/18            Plan - 10/12/18 1741    Clinical Impression Statement  Improving ability to sleep for longer periods with pt able to sleep for 7 hours yesterday. Pt also demonstrates improved ability to perform functional tasks based on his FOTO score. Pt making good progress with PT towards goals. Continued working on thoracic extension, trunk and glute strengthening to help decrease pressure to low back. Pt tolerated session well without aggravation of symptoms.     Rehab Potential  Fair    Clinical Impairments Affecting Rehab Potential  Chronicity of condition    PT Frequency  2x / week    PT Duration  6 weeks    PT Treatment/Interventions  Therapeutic exercise;Neuromuscular re-education;Therapeutic activities;Patient/family education;Manual techniques;Aquatic Therapy;Electrical Stimulation;Iontophoresis 86m/ml Dexamethasone;Traction;Ultrasound    PT Next Visit Plan  thoracic, hip extension, glute and trunk strengthening, manual techniques, modalities PRN    Consulted and  Agree with Plan of Care  Patient       Patient will benefit from skilled therapeutic intervention in order to improve the following deficits and impairments:  Pain, Improper body mechanics, Postural dysfunction, Decreased strength, Difficulty walking  Visit Diagnosis: Chronic bilateral low back pain, unspecified whether sciatica present  Radiculopathy, lumbosacral region  Muscle weakness (generalized)     Problem List There are no active problems to display for this patient.   MJoneen BoersPT, DPT   10/12/2018, 6:20 PM  [facility] [address] [place] [address]Phone: [phone]  Fax:  [phone] Name: JTAVARIS EUDYMRN: [id number] of Birth: [birth year]

## 2018-10-14 ENCOUNTER — Ambulatory Visit: Payer: Managed Care, Other (non HMO)

## 2018-10-14 DIAGNOSIS — M545 Low back pain: Principal | ICD-10-CM

## 2018-10-14 DIAGNOSIS — G8929 Other chronic pain: Secondary | ICD-10-CM

## 2018-10-14 DIAGNOSIS — M5417 Radiculopathy, lumbosacral region: Secondary | ICD-10-CM

## 2018-10-14 DIAGNOSIS — M6281 Muscle weakness (generalized): Secondary | ICD-10-CM

## 2018-10-14 NOTE — Therapy (Addendum)
[place] [facility] [address], [place], [address] Phone: [phone]   Fax:  [phone]  Physical Therapy Treatment And Discharge Summary  Patient Details  Name: Jason Dickson MRN: [id number] Date of Birth: 1961/03/20 Referring Provider (PT): Dion Body, MD   Encounter Date: 10/14/2018  PT End of Session - 10/14/18 1714    Visit Number  11    Number of Visits  13    Date for PT Re-Evaluation  10/22/18    PT Start Time  6644    PT Stop Time  1809    PT Time Calculation (min)  54 min    Activity Tolerance  Patient tolerated treatment well    Behavior During Therapy  [facility] for tasks assessed/performed       Past Medical History:  Diagnosis Date  . Allergy    Information from [place]  . Arthritis    Information from [facility]  . GERD (gastroesophageal reflux disease)    Information from [facility]  . Hypercholesterolemia    Information from [facility]  . Hypertension    Information from [place]    Past Surgical History:  Procedure Laterality Date  . ORIF METACARPAL FRACTURE Right 03/02/2013   Information from [place]; 5th metacarpal    There were no vitals filed for this visit.  Subjective Assessment - 10/14/18 1715    Subjective  Yesterday had tingling L lateral thigh and pain in R low back around 2:30 pm yesterday.  Used an ice pack after work.  Was not able to sleep last night. Also has pain in his L groin area last night and when he woke up this morning. Tried the lunges and the bands this morning and it did not help. Was more active than usual at work.  The pain in his back went from the R side to the L side.  Bending over helps with low back pain but not the L groin pain.  Feels hip joint pain with lunges.  Only woke up one time Monday night.     Pertinent History   Chronic lumbar radiculopathy. Pt has had back pain for 2 years, gradual onset. Currently unable to lie flat on his back or L side. Able to lie on his R side. Today is also his last day taking prednisone. Medications helped decrease his back pain a little bit.  Able to get 2.5 hours of sleep at a time prior to his pain waking him up. Used to be able to bend and twist to make it feel better.  Pt also states that his L leg would tingle > R.  Walking 25 yards feels like walking 10 miles. Legs, hips, and the top of his tail bone would burn like he was working out.  Not being able to do anything makes him increase weight. His physical activities are limited outside of working.  Pt currently is on his feet running machines.   Hips also feel like its rubbing when he walks.  Back also went out around 1999. 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  He was told that one leg is shorter than the other.   Denies loss of bowel or bladder control.  Denies saddle anesthesia.    Back pain has gradually gotten worse since onset 2 years ago.     Patient Stated Goals  Be able  to bend and not hurt. Be able to sleep for 7 hours.     Currently in Pain?  No/denies    Pain Score  0-No pain    Pain Onset  More than a month ago         [facility] PT Assessment - 10/14/18 0001      Observation/Other Assessments   Focus on Therapeutic Outcomes (FOTO)   Lumbar Spine FOTO: 63   measured on 10/12/2018                          PT Education - 10/14/18 1755    Education Details  ther-ex    Person(s) Educated  Patient    Methods  Explanation;Demonstration;Tactile cues;Verbal cues    Comprehension  Returned demonstration;Verbalized understanding      Objective   Pt states blood pressure is controlled.   No latex band allergies   MedbridgeAccess Code: [facility]    Therapeutic exercise  L hip pain with FABER test but did not reproduce L groin pain L hip flexion adduction and IR:  felt good for L low back and posterior hip area, no reproduction of pain   Repeated flexion (10x): no symptoms   Back extension: no L groin pain Side bending: no reproduction of L groin pain Trunk rotation in standing: no L groin pain  Resisted L hip adduction and flexion in hooklying position: reproduced L groin pain.   Possible L pectineus muscle involvement.   hooklying hip adduction glute max and ball squeeze 40 % effort 1 min x 2  Supine hip at 90/90: IR limited B LE ROM  hooklying L hip IR stretch with PT 2x. Improved L hip IR  hooklying self L hip IR stretch. L groin discomfort, possibly impingement related.   seated L hip IR AROM 10x3., L groin discomfort initially which eased with repetition, but L low back discomfort with increased repetition. No pain in both areas during 2nd and 3rd sets.   Seated L piriformis stretch 30 seconds for 3 sets  Reviewed and given as part of his HEP. Pt demonstrated and verbalized understanding.    Improved exercise technique, movement at target joints, use of target muscles after mod verbal, visual, tactile cues.     Pt demonstrates overall decreased back pain, improved hip strength, function, and overall ability to sleep at night since initial evaluation. Had some difficulty sleeping yesterday in which increased activity at work might have played a factor. Also demonstrates L groin discomfort at times reported in which impingement and stress to L pectineus muscle might play a factor. Provided L hip stretch to help address. Pt has made overall good progress with PT towards goals, and skilled physical therapy services discharged due to progress and pt request due to insurance related reasons with pt continuing with exercises at home.          PT Short Term Goals - 10/05/18 1730      PT SHORT TERM GOAL #1   Title  Patient will be independent with his HEP to decrease pain, improve strength and function.     Baseline  Pt has started  his HEP. 09/07/2018; overall performing his HEP except for this past weekend (10/05/2018)    Time  3    Period  Weeks    Status  Partially Met    Target Date  10/01/18        PT Long Term Goals - 10/14/18 1810  PT LONG TERM GOAL #1   Title  Patient will have a decrease in back pain to 4/10 or less at worst to promote ability to ambulate longer distances, perform standing tasks, improve ability to sleep.     Baseline  8.5/10 back pain at worst for the past 3 months (09/07/2018); 3/10 at worst for the past 7 days (10/05/2018)    Time  6    Period  Weeks    Status  Achieved      PT LONG TERM GOAL #2   Title  Pt will improve B hip strength by at least 1/2 MMT grade to promote ability to perform standing tasks with less back and LE pain.     Time  6    Period  Weeks    Status  Achieved      PT LONG TERM GOAL #3   Title  Patient will report being able to sleep at least 5 hours straight without his back pain waking him up to promote ability to rest.     Baseline  Pt able to sleep 2.5 hours at a time due to back pain waking him up (09/07/2018); 3.5 to 5.5 hours (10/05/2018); Able to sleep up to 7 hours 3 nights ago, but woke up multiple times yesterday (10/14/2018)    Time  6    Period  Weeks    Status  Partially Met    Target Date  10/22/18      PT LONG TERM GOAL #4   Title  Patient will improve his lumbar spine FOTO score by at least 10 points as a demonstration of improved function.     Baseline  FOTO 54 (09/07/2018); 63 (10/14/2018)    Time  6    Period  Weeks    Status  Partially Met    Target Date  10/22/18            Plan - 10/14/18 1713    Clinical Impression Statement  Pt demonstrates overall decreased back pain, improved hip strength, function, and overall ability to sleep at night since initial evaluation. Had some difficulty sleeping yesterday in which increased activity at work might have played a factor. Also demonstrates L groin discomfort at times reported in  which impingement and stress to L pectineus muscle might play a factor. Provided L hip stretch to help address. Pt has made overall good progress with PT towards goals, and skilled physical therapy services discharged due to progress and pt request due to insurance related reasons with pt continuing with exercises at home.     History and Personal Factors relevant to plan of care:  Chronicity of condition, difficulty walking longer distances, standing or laying on his back or side for long periods due to pain, leg length difference.     Clinical Presentation  Stable    Clinical Presentation due to:  Pt made good progress with PT towards goals    Clinical Decision Making  Low    Rehab Potential  Fair    Clinical Impairments Affecting Rehab Potential  Chronicity of condition    PT Frequency  --    PT Duration  --    PT Treatment/Interventions  Therapeutic exercise;Neuromuscular re-education;Therapeutic activities;Patient/family education;Manual techniques    PT Next Visit Plan  Continue progress with his HEP    Consulted and Agree with Plan of Care  Patient       Patient will benefit from skilled therapeutic intervention in order to improve the following deficits and  impairments:  Pain, Improper body mechanics, Postural dysfunction, Decreased strength, Difficulty walking  Visit Diagnosis: Chronic bilateral low back pain, unspecified whether sciatica present  Radiculopathy, lumbosacral region  Muscle weakness (generalized)     Problem List There are no active problems to display for this patient.  Thank you for your referral.  Joneen Boers PT, DPT   10/14/2018, 6:25 PM  [place] [facility] [address] [place], [address] Phone: [phone]   Fax:  [phone]  Name: Jason Dickson MRN: [id number] Date of Birth: [birth year]

## 2018-10-14 NOTE — Patient Instructions (Signed)
MedbridgeAccess Code: [email or web address]  Seated Piriformis Stretch with Trunk Bend   30 seconds x 3 for 3 times daily

## 2018-10-19 ENCOUNTER — Ambulatory Visit: Payer: Managed Care, Other (non HMO)

## 2018-10-22 ENCOUNTER — Ambulatory Visit: Payer: Managed Care, Other (non HMO)

## 2020-07-25 ENCOUNTER — Telehealth: Payer: Self-pay

## 2020-07-25 NOTE — Telephone Encounter (Signed)
Contacted patient for lung CT screening clinic based on referral from Dr. Burnadette Pop.  Patient's home number (mobile number) does not have a voice mail that has been set up.  I am unable to leave a message.

## 2020-07-26 ENCOUNTER — Telehealth: Payer: Self-pay

## 2020-07-26 DIAGNOSIS — Z87891 Personal history of nicotine dependence: Secondary | ICD-10-CM

## 2020-07-26 DIAGNOSIS — Z122 Encounter for screening for malignant neoplasm of respiratory organs: Secondary | ICD-10-CM

## 2020-07-26 NOTE — Telephone Encounter (Signed)
Called patient for lung CT screening clinic based on referral from Dr. Burnadette Pop.  Patient is agreeable to program and needs a Friday after 12 appt due to work.  The earliest Friday he is available is Oct 22nd.  I will reach out Glenna Fellows and ask him to communicate with the imaging center to secure a Friday appt as one is not available at this time.  Patient is agreeable for [place] to text him his appt, location of imaging center and phone number to lung navigation. He states he is better to take actual phone calls after 5 because while he is at work he cannot hear his phone ring.  Patient is a former smoker and started smoking at age 61.  He quit smoking in May of 2013, but has been vaping since then.  When he smoked, her smoked 1 to 1/2 packs per day.

## 2020-07-27 ENCOUNTER — Encounter: Payer: Self-pay | Admitting: *Deleted

## 2020-07-27 NOTE — Addendum Note (Signed)
Addended by: Jonne Ply on: 07/27/2020 08:58 AM   Modules accepted: Orders

## 2020-07-27 NOTE — Telephone Encounter (Signed)
Former smoker, quit 02/2012, 46.25 pack year. Appt for [facility] and CT TBD related to patients availability

## 2020-08-04 DIAGNOSIS — Z8616 Personal history of COVID-19: Secondary | ICD-10-CM | POA: Insufficient documentation

## 2020-08-16 ENCOUNTER — Ambulatory Visit
Admission: RE | Admit: 2020-08-16 | Discharge: 2020-08-16 | Disposition: A | Discharge status: Home / Self Care | Payer: Managed Care, Other (non HMO) | Source: Ambulatory Visit | Attending: Infectious Diseases | Admitting: Infectious Diseases

## 2020-08-16 ENCOUNTER — Other Ambulatory Visit
Admission: RE | Admit: 2020-08-16 | Discharge: 2020-08-16 | Disposition: A | Discharge status: Home / Self Care | Payer: Managed Care, Other (non HMO) | Source: Ambulatory Visit | Attending: Infectious Diseases | Admitting: Infectious Diseases

## 2020-08-16 ENCOUNTER — Other Ambulatory Visit: Payer: Self-pay

## 2020-08-16 ENCOUNTER — Other Ambulatory Visit: Payer: Self-pay | Admitting: Infectious Diseases

## 2020-08-16 DIAGNOSIS — J1282 Pneumonia due to coronavirus disease 2019: Secondary | ICD-10-CM | POA: Insufficient documentation

## 2020-08-16 DIAGNOSIS — R0609 Other forms of dyspnea: Secondary | ICD-10-CM

## 2020-08-16 DIAGNOSIS — R7989 Other specified abnormal findings of blood chemistry: Secondary | ICD-10-CM | POA: Insufficient documentation

## 2020-08-16 DIAGNOSIS — U071 COVID-19: Secondary | ICD-10-CM | POA: Insufficient documentation

## 2020-08-16 DIAGNOSIS — R0600 Dyspnea, unspecified: Secondary | ICD-10-CM | POA: Diagnosis present

## 2020-08-16 LAB — FIBRIN DERIVATIVES D-DIMER (ARMC ONLY): Fibrin derivatives D-dimer (ARMC): 2113.09 ng/mL (FEU) — ABNORMAL HIGH (ref 0.00–499.00)

## 2020-08-16 LAB — POCT I-STAT CREATININE: Creatinine, Ser: 0.7 mg/dL (ref 0.61–1.24)

## 2020-08-16 MED ORDER — IOHEXOL 350 MG/ML SOLN
75.0000 mL | Freq: Once | INTRAVENOUS | Status: AC | PRN
  Administered 2020-08-16: 75 mL via INTRAVENOUS

## 2020-08-18 ENCOUNTER — Ambulatory Visit: Payer: Managed Care, Other (non HMO)

## 2020-08-18 ENCOUNTER — Inpatient Hospital Stay: Payer: Managed Care, Other (non HMO) | Admitting: Nurse Practitioner

## 2020-09-08 ENCOUNTER — Inpatient Hospital Stay: Payer: Managed Care, Other (non HMO) | Attending: Nurse Practitioner | Admitting: Nurse Practitioner

## 2020-09-08 ENCOUNTER — Ambulatory Visit: Payer: Managed Care, Other (non HMO) | Attending: Oncology

## 2020-09-18 ENCOUNTER — Telehealth: Payer: Self-pay | Admitting: *Deleted

## 2020-09-18 NOTE — Telephone Encounter (Signed)
Patient's lung screening appts. Have been postponed due to covid infection and ongoing productive cough. Patient requested to consider rescheduling scan in a month.

## 2020-10-17 NOTE — Telephone Encounter (Signed)
appt for lung screening rescheduled.

## 2020-10-18 ENCOUNTER — Other Ambulatory Visit: Payer: Self-pay

## 2020-10-18 ENCOUNTER — Inpatient Hospital Stay (HOSPITAL_BASED_OUTPATIENT_CLINIC_OR_DEPARTMENT_OTHER): Payer: Managed Care, Other (non HMO) | Admitting: Oncology

## 2020-10-18 ENCOUNTER — Ambulatory Visit
Admission: RE | Admit: 2020-10-18 | Discharge: 2020-10-18 | Disposition: A | Discharge status: Home / Self Care | Payer: Managed Care, Other (non HMO) | Source: Ambulatory Visit | Attending: Oncology | Admitting: Oncology

## 2020-10-18 DIAGNOSIS — Z122 Encounter for screening for malignant neoplasm of respiratory organs: Secondary | ICD-10-CM | POA: Insufficient documentation

## 2020-10-18 DIAGNOSIS — Z87891 Personal history of nicotine dependence: Secondary | ICD-10-CM

## 2020-10-18 NOTE — Progress Notes (Signed)
Virtual Visit via Video Note  I connected with Jason Dickson on [birth year] at  2:00 PM EST by a video enabled telemedicine application and verified that I am speaking with the correct person using two identifiers.  Location: Patient: Home Provider: Clinic   I discussed the limitations of evaluation and management by telemedicine and the availability of in person appointments. The patient expressed understanding and agreed to proceed.  I discussed the assessment and treatment plan with the patient. The patient was provided an opportunity to ask questions and all were answered. The patient agreed with the plan and demonstrated an understanding of the instructions.   The patient was advised to call back or seek an in-person evaluation if the symptoms worsen or if the condition fails to improve as anticipated.   In accordance with CMS guidelines, patient has met eligibility criteria including age, absence of signs or symptoms of lung cancer.  Social History   Tobacco Use  . Smoking status: Former Smoker    Packs/day: 1.25    Years: 37.00    Pack years: 46.25    Types: Cigarettes    Quit date: 02/26/2012    Years since quitting: 8.6      A shared decision-making session was conducted prior to the performance of CT scan. This includes one or more decision aids, includes benefits and harms of screening, follow-up diagnostic testing, over-diagnosis, false positive rate, and total radiation exposure.   Counseling on the importance of adherence to annual lung cancer LDCT screening, impact of co-morbidities, and ability or willingness to undergo diagnosis and treatment is imperative for compliance of the program.   Counseling on the importance of continued smoking cessation for former smokers; the importance of smoking cessation for current smokers, and information about tobacco cessation interventions have been given to patient including [place] and [facility] quit [place] programs.   Written  order for lung cancer screening with LDCT has been given to the patient and any and all questions have been answered to the best of my abilities.    Yearly follow up will be coordinated by Burgess Estelle, Thoracic Navigator.  I provided 15 minutes of face-to-face video visit time during this encounter, and > 50% was spent counseling as documented under my assessment & plan.   Jacquelin Hawking, NP

## 2020-10-19 NOTE — Progress Notes (Signed)
FYI

## 2020-10-23 ENCOUNTER — Telehealth: Payer: Self-pay | Admitting: *Deleted

## 2020-10-23 DIAGNOSIS — R918 Other nonspecific abnormal finding of lung field: Secondary | ICD-10-CM

## 2020-10-23 DIAGNOSIS — Z87891 Personal history of nicotine dependence: Secondary | ICD-10-CM

## 2020-10-23 NOTE — Telephone Encounter (Signed)
Notified patient of LDCT lung cancer screening program results with recommendation for 3 month follow up imaging. Also notified of incidental findings noted below and is encouraged to discuss further with PCP who will receive a copy of this note and/or the CT report. Patient verbalizes understanding.   IMPRESSION: 1. Lung-RADS 4A, suspicious. Follow up low-dose chest CT without contrast in 3 months (please use the following order, "CT CHEST LCS NODULE FOLLOW-UP W/O CM") is recommended. 2. Asymmetric, bandlike zone of ground-glass attenuation within the right upper lobe lung is likely the sequelae of recent COVID pneumonia. 3. Coronary artery calcifications. 4. Gallstone.  Aortic Atherosclerosis (ICD10-I70.0) and Emphysema (ICD10-J43.9).

## 2020-11-01 NOTE — Progress Notes (Signed)
Erroneous encounter-disregard

## 2020-12-28 ENCOUNTER — Other Ambulatory Visit: Payer: Self-pay

## 2020-12-28 ENCOUNTER — Ambulatory Visit
Admission: EM | Admit: 2020-12-28 | Discharge: 2020-12-28 | Disposition: A | Discharge status: Home / Self Care | Payer: Managed Care, Other (non HMO) | Attending: Family Medicine | Admitting: Family Medicine

## 2020-12-28 ENCOUNTER — Encounter: Payer: Self-pay | Admitting: Emergency Medicine

## 2020-12-28 DIAGNOSIS — R0789 Other chest pain: Secondary | ICD-10-CM | POA: Insufficient documentation

## 2020-12-28 DIAGNOSIS — E7800 Pure hypercholesterolemia, unspecified: Secondary | ICD-10-CM | POA: Insufficient documentation

## 2020-12-28 LAB — CBC WITH DIFFERENTIAL/PLATELET
Abs Immature Granulocytes: 0.02 K/uL (ref 0.00–0.07)
Basophils Absolute: 0.1 K/uL (ref 0.0–0.1)
Basophils Relative: 1 %
Eosinophils Absolute: 0.3 K/uL (ref 0.0–0.5)
Eosinophils Relative: 4 %
HCT: 39.6 % (ref 39.0–52.0)
Hemoglobin: 13.4 g/dL (ref 13.0–17.0)
Immature Granulocytes: 0 %
Lymphocytes Relative: 27 %
Lymphs Abs: 1.7 K/uL (ref 0.7–4.0)
MCH: 31.8 pg (ref 26.0–34.0)
MCHC: 33.8 g/dL (ref 30.0–36.0)
MCV: 93.8 fL (ref 80.0–100.0)
Monocytes Absolute: 0.6 K/uL (ref 0.1–1.0)
Monocytes Relative: 9 %
Neutro Abs: 3.7 K/uL (ref 1.7–7.7)
Neutrophils Relative %: 59 %
Platelets: 293 K/uL (ref 150–400)
RBC: 4.22 MIL/uL (ref 4.22–5.81)
RDW: 12.1 % (ref 11.5–15.5)
WBC: 6.4 K/uL (ref 4.0–10.5)
nRBC: 0 % (ref 0.0–0.2)

## 2020-12-28 LAB — COMPREHENSIVE METABOLIC PANEL WITH GFR
ALT: 60 U/L — ABNORMAL HIGH (ref 0–44)
AST: 36 U/L (ref 15–41)
Albumin: 4.2 g/dL (ref 3.5–5.0)
Alkaline Phosphatase: 74 U/L (ref 38–126)
Anion gap: 10 (ref 5–15)
BUN: 14 mg/dL (ref 6–20)
CO2: 25 mmol/L (ref 22–32)
Calcium: 9.4 mg/dL (ref 8.9–10.3)
Chloride: 101 mmol/L (ref 98–111)
Creatinine, Ser: 0.9 mg/dL (ref 0.61–1.24)
GFR, Estimated: >60 mL/min
Glucose, Bld: 83 mg/dL (ref 70–99)
Potassium: 4 mmol/L (ref 3.5–5.1)
Sodium: 136 mmol/L (ref 135–145)
Total Bilirubin: 0.8 mg/dL (ref 0.3–1.2)
Total Protein: 7.9 g/dL (ref 6.5–8.1)

## 2020-12-28 LAB — TROPONIN I (HIGH SENSITIVITY): Troponin I (High Sensitivity): 3 ng/L (ref <18)

## 2020-12-28 MED ORDER — NAPROXEN 500 MG PO TABS: 500.0000 mg | ORAL_TABLET | Freq: Two times a day (BID) | ORAL | 0 refills | Status: DC

## 2020-12-28 NOTE — ED Triage Notes (Signed)
Patient c/o mid sternal chest pain that started 3 days ago. He states he has had a few episodes over the last 3 weeks. Denies any other symptoms.

## 2020-12-28 NOTE — Discharge Instructions (Signed)
Medication as prescribed.  Take care  Dr. Lavora Brisbon  

## 2020-12-28 NOTE — ED Provider Notes (Signed)
[facility]    CSN: [id number] Arrival date & time: 12/28/20  1356      History   Chief Complaint Chief Complaint  Patient presents with  . Chest Pain   HPI  60 year old male presents with the above complaint.  Patient reports that he has had intermittent chest pain for the past 2 weeks.  He states that it has been becoming more frequent and worse over the past 3 days.  States that it is sharp.  Located at the xiphoid.  Last for seconds and then resolves.  He is getting concerned given the worsening.  Denies shortness of breath.  Denies diaphoresis.  No nausea or vomiting.  No other reported symptoms.  No other complaints.  Past Medical History:  Diagnosis Date  . Allergy    Information from [facility]  . Arthritis    Information from [facility]  . GERD (gastroesophageal reflux disease)    Information from [facility]  . Hypercholesterolemia    Information from [facility]  . Hypertension    Information from [facility]    Patient Active Problem List   Diagnosis Date Noted  . Pure hypercholesterolemia 12/28/2020  . History of 2019 novel coronavirus disease (COVID-19) 08/04/2020  . Chronic lumbar radiculopathy 07/31/2018  . Benign prostatic hyperplasia with weak urinary stream 10/15/2016  . Essential hypertension 10/15/2016  . Other male erectile dysfunction 10/15/2016  . Borderline diabetes mellitus 04/20/2015  . Mild obesity 10/19/2014    Past Surgical History:  Procedure Laterality Date  . ORIF METACARPAL FRACTURE Right 03/02/2013   Information from [facility]; 5th metacarpal       Home Medications    Prior to Admission medications   Medication Sig Start Date End Date Taking? Authorizing Provider  fluticasone (FLONASE) 50 MCG/ACT nasal spray Place into both nostrils daily.   Yes [provider]  gabapentin (NEURONTIN) 300 MG  capsule Take 300 mg by mouth.   Yes [provider]  lisinopril (PRINIVIL,ZESTRIL) 5 MG tablet Take 5 mg by mouth daily.   Yes [provider]  loratadine (CLARITIN) 10 MG tablet Take 10 mg by mouth daily.   Yes [provider]  naproxen (NAPROSYN) 500 MG tablet Take 1 tablet (500 mg total) by mouth 2 (two) times daily. 12/28/20  Yes Amorita Vanrossum G, DO  omeprazole (PRILOSEC) 20 MG capsule Take 20 mg by mouth daily.   Yes [provider]  simvastatin (ZOCOR) 40 MG tablet TAKE 1 TABLET EVERY DAY 03/04/16  Yes Crissman, Redge Gainer, MD  tadalafil (ADCIRCA/CIALIS) 20 MG tablet Take 20 mg by mouth daily as needed for erectile dysfunction.   Yes [provider]  tamsulosin (FLOMAX) 0.4 MG CAPS capsule Take 0.4 mg by mouth.   Yes [provider]    Family History History reviewed. No pertinent family history.  Social History Social History   Tobacco Use  . Smoking status: Former Smoker    Packs/day: 1.25    Years: 37.00    Pack years: 46.25    Types: Cigarettes    Quit date: 02/26/2012    Years since quitting: 8.8  . Smokeless tobacco: Never Used  Vaping Use  . Vaping Use: Former  Substance Use Topics  . Alcohol use: Not Currently  . Drug use: Never     Allergies   Patient has no known allergies.   Review of Systems Review of Systems  Constitutional:  Negative.   Respiratory: Negative.   Cardiovascular: Positive for chest pain.   Physical Exam Triage Vital Signs ED Triage Vitals  Enc Vitals Group     BP 12/28/20 1409 (!) 150/66     Pulse Rate 12/28/20 1409 88     Resp 12/28/20 1409 18     Temp 12/28/20 1409 98 F (36.7 C)     Temp Source 12/28/20 1409 Oral     SpO2 12/28/20 1409 98 %     Weight 12/28/20 1408 265 lb (120.2 kg)     Height 12/28/20 1408 6' (1.829 m)     Head Circumference --      Peak Flow --      Pain Score 12/28/20 1408 0     Pain Loc --      Pain Edu? --      Excl. in GC? --    Updated Vital Signs BP  (!) 150/66 (BP Location: Right Arm)   Pulse 88   Temp 98 F (36.7 C) (Oral)   Resp 18   Ht 6' (1.829 m)   Wt 120.2 kg   SpO2 98%   BMI 35.94 kg/m   Visual Acuity Right Eye Distance:   Left Eye Distance:   Bilateral Distance:    Right Eye Near:   Left Eye Near:    Bilateral Near:     Physical Exam Vitals and nursing note reviewed.  Constitutional:      General: He is not in acute distress.    Appearance: Normal appearance. He is not ill-appearing.  HENT:     Head: Normocephalic and atraumatic.  Eyes:     General:        Right eye: No discharge.        Left eye: No discharge.     Conjunctiva/sclera: Conjunctivae normal.  Cardiovascular:     Rate and Rhythm: Normal rate and regular rhythm.     Heart sounds: No murmur heard.   Pulmonary:     Effort: Pulmonary effort is normal.     Breath sounds: Normal breath sounds. No wheezing, rhonchi or rales.  Neurological:     Mental Status: He is alert.  Psychiatric:        Mood and Affect: Mood normal.        Behavior: Behavior normal.    UC Treatments / Results  Labs (all labs ordered are listed, but only abnormal results are displayed) Labs Reviewed  COMPREHENSIVE METABOLIC PANEL - Abnormal; Notable for the following components:      Result Value   ALT 60 (*)    All other components within normal limits  CBC WITH DIFFERENTIAL/PLATELET  TROPONIN I (HIGH SENSITIVITY)    EKG Normal sinus rhythm with a rate of 85.  Normal axis.  Normal intervals.  No ST-T wave changes.  Normal EKG.  Radiology No results found.  Procedures Procedures (including critical care time)  Medications Ordered in UC Medications - No data to display  Initial Impression / Assessment and Plan / UC Course  I have reviewed the triage vital signs and the nursing notes.  Pertinent labs & imaging results that were available during my care of the patient were reviewed by me and considered in my medical decision making (see chart for  details).    60 year old male presents with atypical chest pain.  Does not appear to be cardiac in nature.  EKG unremarkable.  Labs unremarkable with negative troponin. MSK vs GERD. Advised increase in Omeprazole.  Naproxen  as directed.  Final Clinical Impressions(s) / UC Diagnoses   Final diagnoses:  Atypical chest pain     Discharge Instructions     Medication as prescribed.  Take care  Dr. Adriana Simas    ED Prescriptions    Medication Sig Dispense Auth. Provider   naproxen (NAPROSYN) 500 MG tablet Take 1 tablet (500 mg total) by mouth 2 (two) times daily. 30 tablet Tommie Sams, DO     PDMP not reviewed this encounter.   Tommie Sams, [place] 12/28/20 1810

## 2021-01-08 ENCOUNTER — Other Ambulatory Visit: Payer: Self-pay | Admitting: Family Medicine

## 2021-01-19 ENCOUNTER — Other Ambulatory Visit: Payer: Self-pay

## 2021-01-19 ENCOUNTER — Ambulatory Visit
Admission: RE | Admit: 2021-01-19 | Discharge: 2021-01-19 | Disposition: A | Discharge status: Home / Self Care | Payer: Managed Care, Other (non HMO) | Source: Ambulatory Visit | Attending: Oncology | Admitting: Oncology

## 2021-01-19 DIAGNOSIS — Z87891 Personal history of nicotine dependence: Secondary | ICD-10-CM | POA: Insufficient documentation

## 2021-01-19 DIAGNOSIS — R918 Other nonspecific abnormal finding of lung field: Secondary | ICD-10-CM | POA: Insufficient documentation

## 2021-01-23 ENCOUNTER — Encounter: Payer: Self-pay | Admitting: *Deleted

## 2021-12-09 ENCOUNTER — Other Ambulatory Visit: Payer: Self-pay

## 2021-12-09 ENCOUNTER — Ambulatory Visit
Admission: EM | Admit: 2021-12-09 | Discharge: 2021-12-09 | Disposition: A | Discharge status: Home / Self Care | Payer: No Typology Code available for payment source | Attending: Emergency Medicine | Admitting: Emergency Medicine

## 2021-12-09 DIAGNOSIS — U071 COVID-19: Secondary | ICD-10-CM

## 2021-12-09 LAB — RESP PANEL BY RT-PCR (FLU A&B, COVID) ARPGX2
Influenza A by PCR: NEGATIVE
Influenza B by PCR: NEGATIVE
SARS Coronavirus 2 by RT PCR: POSITIVE — AB

## 2021-12-09 MED ORDER — FLUTICASONE PROPIONATE 50 MCG/ACT NA SUSP: 2.0000 | Freq: Every day | NASAL | 0 refills | Status: DC

## 2021-12-09 MED ORDER — PROMETHAZINE-DM 6.25-15 MG/5ML PO SYRP: 5.0000 mL | ORAL_SOLUTION | Freq: Four times a day (QID) | ORAL | 0 refills | Status: DC | PRN

## 2021-12-09 MED ORDER — IBUPROFEN 600 MG PO TABS: 600.0000 mg | ORAL_TABLET | Freq: Four times a day (QID) | ORAL | 0 refills | Status: DC | PRN

## 2021-12-09 MED ORDER — MOLNUPIRAVIR EUA 200MG CAPSULE: 4.0000 | ORAL_CAPSULE | Freq: Two times a day (BID) | ORAL | 0 refills | Status: AC

## 2021-12-09 NOTE — ED Provider Notes (Signed)
HPI  SUBJECTIVE:  Jason Dickson is a 61 y.o. male who presents with 3 days of body aches, headaches, fevers Tmax 100, chills, nasal congestion or rhinorrhea, sinus pain and pressure, postnasal drip and a nonproductive cough.  No sore throat, loss of sense of smell or taste, wheezing, shortness of breath, nausea, vomiting, diarrhea, abdominal pain.  No known COVID or flu exposure.  He did not get the COVID or flu vaccines.  He is unable to sleep at night secondary to the cough.  He took Tylenol within 6 hours of evaluation.  He has also tried cold and flu medication without improvement in his symptoms.  No aggravating factors.  He has a past medical history of hypertension, COVID in 2021.  He denies any other medical problems.  Past Medical History:  Diagnosis Date   Allergy    Information from [place] [facility] [place]   GERD (gastroesophageal reflux disease)    Information from [place]   Hypercholesterolemia    Information from [place]   Hypertension    Information from [place]    Past Surgical History:  Procedure Laterality Date   ORIF METACARPAL FRACTURE Right 03/02/2013   Information from [place]; 5th metacarpal    History reviewed. No pertinent family history.  Social History   Tobacco Use   Smoking status: Former    Packs/day: 1.25    Years: 37.00    Pack years: 46.25    Types: Cigarettes    Quit date: 02/26/2012    Years since quitting: 9.7   Smokeless tobacco: Never  Vaping Use   Vaping Use: Former  Substance Use Topics   Alcohol use: Not Currently   Drug use: Never    No current facility-administered medications for this encounter.  Current Outpatient Medications:    fluticasone (FLONASE) 50 MCG/ACT nasal spray, Place 2 sprays into both nostrils daily., Disp: 16 g, Rfl: 0   ibuprofen (ADVIL) 600 MG tablet,  Take 1 tablet (600 mg total) by mouth every 6 (six) hours as needed., Disp: 30 tablet, Rfl: 0   lisinopril (PRINIVIL,ZESTRIL) 5 MG tablet, Take 5 mg by mouth daily., Disp: , Rfl:    molnupiravir EUA (LAGEVRIO) 200 mg CAPS capsule, Take 4 capsules (800 mg total) by mouth 2 (two) times daily for 5 days., Disp: 40 capsule, Rfl: 0   omeprazole (PRILOSEC) 20 MG capsule, Take 20 mg by mouth daily., Disp: , Rfl:    promethazine-dextromethorphan (PROMETHAZINE-DM) 6.25-15 MG/5ML syrup, Take 5 mLs by mouth 4 (four) times daily as needed for cough., Disp: 118 mL, Rfl: 0   simvastatin (ZOCOR) 40 MG tablet, TAKE 1 TABLET EVERY DAY, Disp: 30 tablet, Rfl: 1   tadalafil (ADCIRCA/CIALIS) 20 MG tablet, Take 20 mg by mouth daily as needed for erectile dysfunction., Disp: , Rfl:    tamsulosin (FLOMAX) 0.4 MG CAPS capsule, Take 0.4 mg by mouth., Disp: , Rfl:   No Known Allergies   ROS  As noted in HPI.   Physical Exam  BP 132/66 (BP Location: Left Arm)    Pulse 83    Temp 98.1 F (36.7 C) (Oral)    Resp 18    Ht 6' (1.829 m)    Wt 122.5 kg    SpO2 97%    BMI 36.62 kg/m   Constitutional: Well developed, well nourished, no acute distress Eyes:  EOMI, conjunctiva  normal bilaterally HENT: Normocephalic, atraumatic,mucus membranes moist.  Positive clear rhinorrhea.  Normal turbinates.  No maxillary, frontal sinus tenderness.  Tonsils normal without exudates.  Uvula midline.  Positive extensive postnasal drip. Neck: No cervical lymphadenopathy Respiratory: Normal inspiratory effort, lungs clear bilaterally Cardiovascular: Normal rate, regular rhythm, no murmurs rubs or gallops GI: nondistended skin: No rash, skin intact Musculoskeletal: no deformities Neurologic: Alert & oriented x 3, no focal neuro deficits Psychiatric: Speech and behavior appropriate   ED Course   Medications - No data to display  Orders Placed This Encounter  Procedures   Resp Panel by RT-PCR (Flu A&B, Covid) Nasopharyngeal Swab     Standing Status:   Standing    Number of Occurrences:   1   Airborne and Contact precautions    Standing Status:   Standing    Number of Occurrences:   1    Results for orders placed or performed during the hospital encounter of 12/09/21 (from the past 24 hour(s))  Resp Panel by RT-PCR (Flu A&B, Covid) Nasopharyngeal Swab     Status: Abnormal   Collection Time: 12/09/21 11:51 AM   Specimen: Nasopharyngeal Swab; Nasopharyngeal(NP) swabs in vial transport medium  Result Value Ref Range   SARS Coronavirus 2 by RT PCR POSITIVE (A) NEGATIVE   Influenza A by PCR NEGATIVE NEGATIVE   Influenza B by PCR NEGATIVE NEGATIVE   No results found.  ED Clinical Impression  1. COVID-19 virus infection      ED Assessment/Plan  Patient with COVID infection.  Home with Molnupiravir, Tylenol/ibuprofen, Flonase, saline nasal irrigation, Promethazine DM.  Patient to return here or see his doctor for double sickening, worsening fevers, or if he is not better in a week for reevaluation and antibiotics if necessary.  Deferring chest x-ray today because his lungs are clear, he is satting well on room air.  Discussed labs,, MDM, treatment plan, and plan for follow-up with patient. patient agrees with plan.   Meds ordered this encounter  Medications   molnupiravir EUA (LAGEVRIO) 200 mg CAPS capsule    Sig: Take 4 capsules (800 mg total) by mouth 2 (two) times daily for 5 days.    Dispense:  40 capsule    Refill:  0   fluticasone (FLONASE) 50 MCG/ACT nasal spray    Sig: Place 2 sprays into both nostrils daily.    Dispense:  16 g    Refill:  0   promethazine-dextromethorphan (PROMETHAZINE-DM) 6.25-15 MG/5ML syrup    Sig: Take 5 mLs by mouth 4 (four) times daily as needed for cough.    Dispense:  118 mL    Refill:  0   ibuprofen (ADVIL) 600 MG tablet    Sig: Take 1 tablet (600 mg total) by mouth every 6 (six) hours as needed.    Dispense:  30 tablet    Refill:  0      *This clinic note was created  using [employer]. Therefore, there may be occasional mistakes despite careful proofreading.  ?    Melynda Ripple, MD 12/09/21 [phone]

## 2021-12-09 NOTE — Discharge Instructions (Addendum)
You COVID is positive.  Finish the [employer], even if you feel better.  May take 600 mg of ibuprofen, 1000 mg of Tylenol 3-4 times a day as needed for pain, headache, fever.  Flonase, saline nasal irrigation with a Lloyd Huger Med rinse and distilled water as often as you want.  Promethazine DM for the cough.  Get out and walk a little bit every day to prevent pneumonias and blood clots.

## 2021-12-09 NOTE — ED Triage Notes (Signed)
Pt here with C/O runny nose, cough, cold chills, fever (100), body aches since Friday.

## 2022-01-25 ENCOUNTER — Other Ambulatory Visit: Payer: Self-pay | Admitting: *Deleted

## 2022-01-25 DIAGNOSIS — Z87891 Personal history of nicotine dependence: Secondary | ICD-10-CM

## 2022-02-08 ENCOUNTER — Ambulatory Visit
Admission: RE | Admit: 2022-02-08 | Discharge: 2022-02-08 | Disposition: A | Discharge status: Home / Self Care | Payer: No Typology Code available for payment source | Source: Ambulatory Visit | Attending: Acute Care | Admitting: Acute Care

## 2022-02-08 ENCOUNTER — Other Ambulatory Visit: Payer: Self-pay

## 2022-02-08 DIAGNOSIS — Z87891 Personal history of nicotine dependence: Secondary | ICD-10-CM | POA: Diagnosis not present

## 2022-02-12 ENCOUNTER — Other Ambulatory Visit: Payer: Self-pay | Admitting: Acute Care

## 2022-02-12 DIAGNOSIS — Z87891 Personal history of nicotine dependence: Secondary | ICD-10-CM

## 2022-02-12 DIAGNOSIS — Z122 Encounter for screening for malignant neoplasm of respiratory organs: Secondary | ICD-10-CM

## 2022-03-30 ENCOUNTER — Emergency Department
Admission: EM | Admit: 2022-03-30 | Discharge: 2022-03-30 | Disposition: A | Discharge status: Home / Self Care | Payer: No Typology Code available for payment source | Attending: Emergency Medicine | Admitting: Emergency Medicine

## 2022-03-30 ENCOUNTER — Encounter: Payer: Self-pay | Admitting: Medical Oncology

## 2022-03-30 ENCOUNTER — Emergency Department: Payer: No Typology Code available for payment source

## 2022-03-30 DIAGNOSIS — Y92410 Unspecified street and highway as the place of occurrence of the external cause: Secondary | ICD-10-CM | POA: Insufficient documentation

## 2022-03-30 DIAGNOSIS — M79661 Pain in right lower leg: Secondary | ICD-10-CM | POA: Diagnosis not present

## 2022-03-30 DIAGNOSIS — M25571 Pain in right ankle and joints of right foot: Secondary | ICD-10-CM | POA: Insufficient documentation

## 2022-03-30 DIAGNOSIS — I10 Essential (primary) hypertension: Secondary | ICD-10-CM | POA: Diagnosis not present

## 2022-03-30 MED ORDER — HYDROCODONE-ACETAMINOPHEN 5-325 MG PO TABS
1.0000 | ORAL_TABLET | Freq: Once | ORAL | Status: AC
  Administered 2022-03-30: 1 via ORAL
  Filled 2022-03-30: qty 1

## 2022-03-30 MED ORDER — NAPROXEN 500 MG PO TABS: 500.0000 mg | ORAL_TABLET | Freq: Two times a day (BID) | ORAL | 11 refills | Status: DC

## 2022-03-30 MED ORDER — CYCLOBENZAPRINE HCL 10 MG PO TABS: 10.0000 mg | ORAL_TABLET | Freq: Three times a day (TID) | ORAL | 0 refills | Status: AC | PRN
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
## 2022-03-30 NOTE — Discharge Instructions (Addendum)
-  Take Tylenol/naproxen as needed for pain.  You may additionally take cyclobenzaprine for muscle spasms, though utilize caution as this may make you drowsy throughout the day.  -Return to the emergency department anytime if you begin to experience any new or worsening symptoms.

## 2022-05-05 ENCOUNTER — Ambulatory Visit
Admission: EM | Admit: 2022-05-05 | Discharge: 2022-05-05 | Disposition: A | Discharge status: Home / Self Care | Payer: No Typology Code available for payment source | Attending: Physician Assistant | Admitting: Physician Assistant

## 2022-05-05 ENCOUNTER — Encounter: Payer: Self-pay | Admitting: Emergency Medicine

## 2022-05-05 ENCOUNTER — Ambulatory Visit (INDEPENDENT_AMBULATORY_CARE_PROVIDER_SITE_OTHER): Payer: No Typology Code available for payment source

## 2022-05-05 DIAGNOSIS — M25512 Pain in left shoulder: Secondary | ICD-10-CM

## 2022-05-05 MED ORDER — CYCLOBENZAPRINE HCL 10 MG PO TABS: 10.0000 mg | ORAL_TABLET | Freq: Two times a day (BID) | ORAL | 0 refills | Status: AC | PRN

## 2022-05-05 MED ORDER — NAPROXEN 500 MG PO TABS: 500.0000 mg | ORAL_TABLET | Freq: Two times a day (BID) | ORAL | 0 refills | Status: AC

## 2022-05-05 NOTE — Discharge Instructions (Signed)
SPRAIN: X-ray of the shoulder was normal.  However, there is some concern about a possible rotator cuff tear so if your range of motion is not improving over the next week you may need to follow-up with Ortho for MRI.  Stressed avoiding painful activities . Reviewed RICE guidelines. Use medications as directed, including NSAIDs. If no NSAIDs have been prescribed for you today, you may take Aleve or Motrin over the counter. May use Tylenol in between doses of NSAIDs.  If no improvement in the next 1-2 weeks, f/u with PCP or one of the offices below.  You have a condition requiring you to follow up with Orthopedics so please call one of the following office for appointment:   [facility] [address] [place], [place] [address] Phone: [phone]  [facility] [address] [place], [place] [address] Phone: [phone]
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
## 2022-07-26 ENCOUNTER — Other Ambulatory Visit: Payer: Self-pay | Admitting: Physician Assistant

## 2022-07-26 DIAGNOSIS — M542 Cervicalgia: Secondary | ICD-10-CM

## 2022-08-09 ENCOUNTER — Ambulatory Visit
Admission: RE | Admit: 2022-08-09 | Discharge: 2022-08-09 | Disposition: A | Discharge status: Home / Self Care | Payer: No Typology Code available for payment source | Source: Ambulatory Visit | Attending: Physician Assistant | Admitting: Physician Assistant

## 2022-08-09 DIAGNOSIS — M542 Cervicalgia: Secondary | ICD-10-CM

## 2022-08-23 ENCOUNTER — Other Ambulatory Visit: Payer: Self-pay | Admitting: Physician Assistant

## 2022-08-23 DIAGNOSIS — M7542 Impingement syndrome of left shoulder: Secondary | ICD-10-CM

## 2022-08-24 ENCOUNTER — Ambulatory Visit
Admission: EM | Admit: 2022-08-24 | Discharge: 2022-08-24 | Disposition: A | Discharge status: Home / Self Care | Payer: No Typology Code available for payment source | Attending: Physician Assistant | Admitting: Physician Assistant

## 2022-08-24 ENCOUNTER — Encounter: Payer: Self-pay | Admitting: Emergency Medicine

## 2022-08-24 DIAGNOSIS — Z20822 Contact with and (suspected) exposure to covid-19: Secondary | ICD-10-CM | POA: Diagnosis not present

## 2022-08-24 DIAGNOSIS — Z87891 Personal history of nicotine dependence: Secondary | ICD-10-CM | POA: Insufficient documentation

## 2022-08-24 DIAGNOSIS — R051 Acute cough: Secondary | ICD-10-CM

## 2022-08-24 DIAGNOSIS — R0981 Nasal congestion: Secondary | ICD-10-CM | POA: Diagnosis present

## 2022-08-24 DIAGNOSIS — E785 Hyperlipidemia, unspecified: Secondary | ICD-10-CM | POA: Insufficient documentation

## 2022-08-24 DIAGNOSIS — I10 Essential (primary) hypertension: Secondary | ICD-10-CM | POA: Insufficient documentation

## 2022-08-24 DIAGNOSIS — Z8616 Personal history of COVID-19: Secondary | ICD-10-CM | POA: Insufficient documentation

## 2022-08-24 DIAGNOSIS — J069 Acute upper respiratory infection, unspecified: Secondary | ICD-10-CM | POA: Insufficient documentation

## 2022-08-24 LAB — SARS CORONAVIRUS 2 BY RT PCR: SARS Coronavirus 2 by RT PCR: NEGATIVE

## 2022-08-24 MED ORDER — IPRATROPIUM BROMIDE 0.06 % NA SOLN: 2.0000 | Freq: Four times a day (QID) | NASAL | 0 refills | Status: DC

## 2022-08-24 MED ORDER — PSEUDOEPH-BROMPHEN-DM 30-2-10 MG/5ML PO SYRP: 10.0000 mL | ORAL_SOLUTION | Freq: Four times a day (QID) | ORAL | 0 refills | Status: AC | PRN

## 2022-08-24 NOTE — Discharge Instructions (Addendum)
-  Negative COVID test. - You have a viral upper respiratory infection.  Supportive care encouraged.  Increase rest and fluids. - I have sent a nasal spray and cough medication to the pharmacy. - You may also take ibuprofen and/or Tylenol for any fever, headaches, aches and pains. - Most colds get better within 7 to 10 days. - If you run a fever or have worsening cough, worsening sinus pain, are not better after 10 days or have breathing difficulty need to be seen again right away.

## 2022-08-24 NOTE — ED Triage Notes (Signed)
Patient c/o cough, sneezing and nasal congestion that started last night.  Patient unsure of fevers.

## 2022-08-24 NOTE — ED Provider Notes (Signed)
[facility]    CSN: [id number] Arrival date & time: 08/24/22  1528      History   Chief Complaint Chief Complaint  Patient presents with   Nasal Congestion   Cough    HPI Jason Dickson is a 61 y.o. male presenting for acute onset of nasal congestion, sinus pressure and cough yesterday afternoon.  He denies any associated fever, sore throat, chest pain or breathing difficulty.  No vomiting or diarrhea.  Reports his daughter has had similar symptoms.  He denies any known COVID exposure.  He reports a history of COVID earlier this year.  Also had COVID in 2021.  He has been vaccinated for COVID-19 and denies wanting to have the vaccine.  He has taken OTC cough medication for his current symptoms.  He is history of allergies, hypertension, hyperlipidemia.  No history of asthma or COPD.  No other complaints.  HPI  Past Medical History:  Diagnosis Date   Allergy    Information from [facility]   Arthritis    Information from [facility]   GERD (gastroesophageal reflux disease)    Information from [facility]   Hypercholesterolemia    Information from [facility]   Hypertension    Information from [facility]    Patient Active Problem List   Diagnosis Date Noted   Pure hypercholesterolemia 12/28/2020   History of 2019 novel coronavirus disease (COVID-19) 08/04/2020   Chronic lumbar radiculopathy 07/31/2018   Benign prostatic hyperplasia with weak urinary stream 10/15/2016   Essential hypertension 10/15/2016   Other male erectile dysfunction 10/15/2016   Borderline diabetes mellitus 04/20/2015   Mild obesity 10/19/2014    Past Surgical History:  Procedure Laterality Date   ORIF METACARPAL FRACTURE Right 03/02/2013   Information from [facility]; 5th metacarpal       Home Medications    Prior to Admission medications   Medication Sig Start Date  End Date Taking? Authorizing Provider  brompheniramine-pseudoephedrine-DM 30-2-10 MG/5ML syrup Take 10 mLs by mouth 4 (four) times daily as needed for up to 7 days. 08/24/22 08/31/22 Yes Eusebio Friendly B, PA-C  ipratropium (ATROVENT) 0.06 % nasal spray Place 2 sprays into both nostrils 4 (four) times daily. 08/24/22  Yes Eusebio Friendly B, PA-C  lisinopril (PRINIVIL,ZESTRIL) 5 MG tablet Take 5 mg by mouth daily.   Yes [provider]  omeprazole (PRILOSEC) 20 MG capsule Take 20 mg by mouth daily.   Yes [provider]  simvastatin (ZOCOR) 40 MG tablet TAKE 1 TABLET EVERY DAY 03/04/16  Yes Crissman, Redge Gainer, MD  ibuprofen (ADVIL) 600 MG tablet Take 1 tablet (600 mg total) by mouth every 6 (six) hours as needed. 12/09/21   Domenick Gong, MD  tadalafil (ADCIRCA/CIALIS) 20 MG tablet Take 20 mg by mouth daily as needed for erectile dysfunction.    [provider]  tamsulosin (FLOMAX) 0.4 MG CAPS capsule Take 0.4 mg by mouth.    [provider]    Family History History reviewed. No pertinent family history.  Social History Social History   Tobacco Use   Smoking status: Former    Packs/day: 1.25    Years: 37.00    Total pack years: 46.25    Types: Cigarettes    Quit date: 02/26/2012    Years since quitting: 10.4   Smokeless tobacco: Never  Vaping Use   Vaping Use: Former  Substance Use Topics  Alcohol use: Not Currently   Drug use: Never     Allergies   Patient has no known allergies.   Review of Systems Review of Systems  Constitutional:  Negative for fatigue and fever.  HENT:  Positive for congestion, rhinorrhea, sinus pressure and sneezing. Negative for sinus pain and sore throat.   Respiratory:  Positive for cough. Negative for shortness of breath.   Cardiovascular:  Negative for chest pain.  Gastrointestinal:  Negative for abdominal pain, diarrhea, nausea and vomiting.  Musculoskeletal:  Negative for myalgias.  Neurological:  Negative for  weakness, light-headedness and headaches.  Hematological:  Negative for adenopathy.     Physical Exam Triage Vital Signs ED Triage Vitals  Enc Vitals Group     BP 08/24/22 1538 130/71     Pulse Rate 08/24/22 1538 97     Resp 08/24/22 1538 15     Temp 08/24/22 1538 97.9 F (36.6 C)     Temp Source 08/24/22 1538 Oral     SpO2 08/24/22 1538 97 %     Weight 08/24/22 1535 240 lb (108.9 kg)     Height 08/24/22 1535 5\' 11"  (1.803 m)     Head Circumference --      Peak Flow --      Pain Score 08/24/22 1535 0     Pain Loc --      Pain Edu? --      Excl. in [place]? --    No data found.  Updated Vital Signs BP 130/71 (BP Location: Left Arm)   Pulse 97   Temp 97.9 F (36.6 C) (Oral)   Resp 15   Ht 5\' 11"  (1.803 m)   Wt 240 lb (108.9 kg)   SpO2 97%   BMI 33.47 kg/m      Physical Exam Vitals and nursing note reviewed.  Constitutional:      General: He is not in acute distress.    Appearance: Normal appearance. He is well-developed. He is ill-appearing.  HENT:     Head: Normocephalic and atraumatic.     Nose: Congestion and rhinorrhea present.     Mouth/Throat:     Mouth: Mucous membranes are moist.     Pharynx: Oropharynx is clear.  Eyes:     General: No scleral icterus.    Conjunctiva/sclera: Conjunctivae normal.  Cardiovascular:     Rate and Rhythm: Normal rate and regular rhythm.     Heart sounds: Normal heart sounds.  Pulmonary:     Effort: Pulmonary effort is normal. No respiratory distress.     Breath sounds: Normal breath sounds.  Musculoskeletal:     Cervical back: Neck supple.  Skin:    General: Skin is warm and dry.     Capillary Refill: Capillary refill takes less than 2 seconds.  Neurological:     General: No focal deficit present.     Mental Status: He is alert. Mental status is at baseline.     Motor: No weakness.     Gait: Gait normal.  Psychiatric:        Mood and Affect: Mood normal.        Behavior: Behavior normal.      UC Treatments /  Results  Labs (all labs ordered are listed, but only abnormal results are displayed) Labs Reviewed  SARS CORONAVIRUS 2 BY RT PCR    EKG   Radiology No results found.  Procedures Procedures (including critical care time)  Medications Ordered in UC Medications - No data  to display  Initial Impression / Assessment and Plan / UC Course  I have reviewed the triage vital signs and the nursing notes.  Pertinent labs & imaging results that were available during my care of the patient were reviewed by me and considered in my medical decision making (see chart for details).   61 year old male presents for onset of cough, congestion, sneezing and runny nose yesterday at about this time.  No associated fever or breathing difficulty.  Has been around his daughter who has had similar symptoms.  Vitals normal and stable.  Patient is mildly ill-appearing but nontoxic.  He has significant nasal congestion and clear drainage.  Throat is clear.  Chest clear to auscultation heart regular rate and rhythm.  PCR COVID is obtained.  Reviewed current CDC guidelines, isolation protocol and ED precautions with patient if positive for COVID-19.  He would be interested in an antiviral medication.  Advised him if his COVID test is negative he has another virus and care is supportive.  We discussed that I can send cough medication and nasal spray for him.  Advised him that most colds get better within 7 to 10 days.  Reviewed return and ER precautions.  Negative COVID test.   Final Clinical Impressions(s) / UC Diagnoses   Final diagnoses:  Viral upper respiratory tract infection  Acute cough  Nasal congestion     Discharge Instructions      -Negative COVID test. - You have a viral upper respiratory infection.  Supportive care encouraged.  Increase rest and fluids. - I have sent a nasal spray and cough medication to the pharmacy. - You may also take ibuprofen and/or Tylenol for any fever, headaches,  aches and pains. - Most colds get better within 7 to 10 days. - If you run a fever or have worsening cough, worsening sinus pain, are not better after 10 days or have breathing difficulty need to be seen again right away.     ED Prescriptions     Medication Sig Dispense Auth. Provider   brompheniramine-pseudoephedrine-DM 30-2-10 MG/5ML syrup Take 10 mLs by mouth 4 (four) times daily as needed for up to 7 days. 150 mL Laurene Footman B, PA-C   ipratropium (ATROVENT) 0.06 % nasal spray Place 2 sprays into both nostrils 4 (four) times daily. 15 mL Danton Clap, PA-C      PDMP not reviewed this encounter.   Danton Clap, PA-C 08/24/22 [place], [place], PA-C 08/25/22 [phone]

## 2022-09-06 ENCOUNTER — Ambulatory Visit
Admission: RE | Admit: 2022-09-06 | Discharge: 2022-09-06 | Disposition: A | Discharge status: Home / Self Care | Payer: No Typology Code available for payment source | Source: Ambulatory Visit | Attending: Physician Assistant | Admitting: Physician Assistant

## 2022-09-06 DIAGNOSIS — M7542 Impingement syndrome of left shoulder: Secondary | ICD-10-CM

## 2022-09-13 ENCOUNTER — Other Ambulatory Visit: Payer: No Typology Code available for payment source

## 2022-10-01 ENCOUNTER — Other Ambulatory Visit: Payer: Self-pay | Admitting: Orthopedic Surgery

## 2022-10-11 ENCOUNTER — Encounter
Admission: RE | Admit: 2022-10-11 | Discharge: 2022-10-11 | Disposition: A | Discharge status: Home / Self Care | Payer: No Typology Code available for payment source | Source: Ambulatory Visit | Attending: Orthopedic Surgery | Admitting: Orthopedic Surgery

## 2022-10-11 VITALS — Ht 71.0 in | Wt 245.0 lb

## 2022-10-11 DIAGNOSIS — I10 Essential (primary) hypertension: Secondary | ICD-10-CM

## 2022-10-11 HISTORY — DX: Prediabetes: R73.03

## 2022-10-11 NOTE — Patient Instructions (Addendum)
Your procedure is scheduled on: Friday October 18, 2022. Report to Day Surgery inside [facility] 2nd floor, stop by registration desk before getting on elevator. To find out your arrival time please call [phone] between 1PM - 3PM on Thursday October 17, 2022.  Remember: Instructions that are not followed completely may result in serious medical risk,  up to and including death, or upon the discretion of your surgeon and anesthesiologist your  surgery may need to be rescheduled.     _X__ 1. Do not eat food after midnight the night before your procedure.                 No chewing gum or hard candies. You may drink clear liquids up to 2 hours                 before you are scheduled to arrive for your surgery- DO not drink clear                 liquids within 2 hours of the start of your surgery.                 Clear Liquids include:  water, apple juice without pulp, Black Coffee or Tea (Do not add                 anything to coffee or tea).  __X__2.  On the morning of surgery brush your teeth with toothpaste and water, you                may rinse your mouth with mouthwash if you wish.  Do not swallow any toothpaste or mouthwash.     _X__ 3.  No Alcohol for 24 hours before or after surgery.   _X__ 4.  Do Not Smoke or use e-cigarettes For 24 Hours Prior to Your Surgery.                 Do not use any chewable tobacco products for at least 6 hours prior to                 Surgery.  _X__  5.  Do not use any recreational drugs (marijuana, cocaine, heroin, ecstasy, MDMA or other)                For at least one week prior to your surgery.  Combination of these drugs with anesthesia                May have life threatening results.  ____  6.  Bring all medications with you on the day of surgery if instructed.   __X__  7.  Notify your doctor if there is any change in your medical condition      (cold, fever, infections).     Do not wear jewelry, make-up,  hairpins, clips or nail polish. Do not wear lotions, powders, or perfumes. You may wear deodorant. Do not shave 48 hours prior to surgery. Men may shave face and neck. Do not bring valuables to the hospital.    [facility] is not responsible for any belongings or valuables.  Contacts, dentures or bridgework may not be worn into surgery. Leave your suitcase in the car. After surgery it may be brought to your room. For patients admitted to the hospital, discharge time is determined by your treatment team.   Patients discharged the day of surgery will not be allowed to drive home.   Make arrangements for someone  to be with you for the first 24 hours of your Same Day Discharge.   __X__ Take these medicines the morning of surgery with A SIP OF WATER:    1. omeprazole (PRILOSEC) 20 MG   2. loratadine (CLARITIN) 10 MG   3.   4.  5.  6.  ____ Fleet Enema (as directed)   __X__ Use CHG Soap (or wipes) as directed  ____ Use Benzoyl Peroxide Gel as instructed  __X__ Use inhalers on the day of surgery  ipratropium (ATROVENT) 0.06 % nasal spray   ____ Stop metformin 2 days prior to surgery    ____ Take 1/2 of usual insulin dose the night before surgery. No insulin the morning          of surgery.   ____ Call your PCP, cardiologist, or Pulmonologist if taking Coumadin/Plavix/aspirin and ask when to stop before your surgery.   __X__ One Week prior to surgery- Stop Anti-inflammatories such as Ibuprofen, Aleve, Advil, Motrin, meloxicam (MOBIC), diclofenac, etodolac, ketorolac, Toradol, Daypro, piroxicam, Goody's or BC powders. OK TO USE TYLENOL IF NEEDED   __X__ One week prior to surgery stop all vitamins and or supplements until after surgery. (VITAMIN C, VITAMIN D and vitamin B)   ____ Bring C-Pap to the hospital.    If you have any questions regarding your pre-procedure instructions,  Please call Pre-admit Testing at [phone]     Preparing for Surgery with CHLORHEXIDINE  GLUCONATE (CHG) Soap  Chlorhexidine Gluconate (CHG) Soap  o An antiseptic cleaner that kills germs and bonds with the skin to continue killing germs even after washing  o Used for showering the night before surgery and morning of surgery  Before surgery, you can play an important role by reducing the number of germs on your skin.  CHG (Chlorhexidine gluconate) soap is an antiseptic cleanser which kills germs and bonds with the skin to continue killing germs even after washing.  Please do not use if you have an allergy to CHG or antibacterial soaps. If your skin becomes reddened/irritated stop using the CHG.  1. Shower the NIGHT BEFORE SURGERY and the MORNING OF SURGERY with CHG soap.  2. If you choose to wash your hair, wash your hair first as usual with your normal shampoo.  3. After shampooing, rinse your hair and body thoroughly to remove the shampoo.  4. Use CHG as you would any other liquid soap. You can apply CHG directly to the skin and wash gently with a scrungie or a clean washcloth.  5. Apply the CHG soap to your body only from the neck down. Do not use on open wounds or open sores. Avoid contact with your eyes, ears, mouth, and genitals (private parts). Wash face and genitals (private parts) with your normal soap.  6. Wash thoroughly, paying special attention to the area where your surgery will be performed.  7. Thoroughly rinse your body with warm water.  8. Do not shower/wash with your normal soap after using and rinsing off the CHG soap.  9. Pat yourself dry with a clean towel.  10. Wear clean pajamas to bed the night before surgery.  12. Place clean sheets on your bed the night of your first shower and do not sleep with pets.  13. Shower again with the CHG soap on the day of surgery prior to arriving at the hospital.  14. Do not apply any deodorants/lotions/powders.  15. Please wear clean clothes to the hospital.

## 2022-10-14 ENCOUNTER — Encounter
Admission: RE | Admit: 2022-10-14 | Discharge: 2022-10-14 | Disposition: A | Discharge status: Home / Self Care | Payer: No Typology Code available for payment source | Source: Ambulatory Visit | Attending: Orthopedic Surgery | Admitting: Orthopedic Surgery

## 2022-10-14 DIAGNOSIS — Z01810 Encounter for preprocedural cardiovascular examination: Secondary | ICD-10-CM | POA: Diagnosis present

## 2022-10-14 DIAGNOSIS — I10 Essential (primary) hypertension: Secondary | ICD-10-CM | POA: Diagnosis not present

## 2022-10-17 MED ORDER — CEFAZOLIN SODIUM-DEXTROSE 2-4 GM/100ML-% IV SOLN
2.0000 g | INTRAVENOUS | Status: AC
  Administered 2022-10-18: 2 g via INTRAVENOUS

## 2022-10-17 MED ORDER — CHLORHEXIDINE GLUCONATE 0.12 % MT SOLN: 15.0000 mL | Freq: Once | OROMUCOSAL | Status: AC

## 2022-10-17 MED ORDER — LACTATED RINGERS IV SOLN: INTRAVENOUS | Status: DC

## 2022-10-17 MED ORDER — ORAL CARE MOUTH RINSE
15.0000 mL | Freq: Once | OROMUCOSAL | Status: AC
  Administered 2022-10-18: 15 mL via OROMUCOSAL

## 2022-10-18 ENCOUNTER — Other Ambulatory Visit: Payer: Self-pay

## 2022-10-18 ENCOUNTER — Ambulatory Visit: Payer: No Typology Code available for payment source | Admitting: Anesthesiology

## 2022-10-18 ENCOUNTER — Encounter: Payer: Self-pay | Admitting: Orthopedic Surgery

## 2022-10-18 ENCOUNTER — Encounter
Admission: RE | Disposition: A | Discharge status: Home / Self Care | Payer: Self-pay | Source: Home / Self Care | Attending: Orthopedic Surgery

## 2022-10-18 ENCOUNTER — Ambulatory Visit
Admission: RE | Admit: 2022-10-18 | Discharge: 2022-10-18 | Disposition: A | Discharge status: Home / Self Care | Payer: No Typology Code available for payment source | Attending: Orthopedic Surgery | Admitting: Orthopedic Surgery

## 2022-10-18 ENCOUNTER — Ambulatory Visit: Payer: No Typology Code available for payment source

## 2022-10-18 DIAGNOSIS — Z87891 Personal history of nicotine dependence: Secondary | ICD-10-CM | POA: Insufficient documentation

## 2022-10-18 DIAGNOSIS — M25812 Other specified joint disorders, left shoulder: Secondary | ICD-10-CM | POA: Insufficient documentation

## 2022-10-18 DIAGNOSIS — S46022A Laceration of muscle(s) and tendon(s) of the rotator cuff of left shoulder, initial encounter: Secondary | ICD-10-CM | POA: Diagnosis not present

## 2022-10-18 DIAGNOSIS — M19012 Primary osteoarthritis, left shoulder: Secondary | ICD-10-CM | POA: Insufficient documentation

## 2022-10-18 DIAGNOSIS — M75102 Unspecified rotator cuff tear or rupture of left shoulder, not specified as traumatic: Secondary | ICD-10-CM | POA: Diagnosis present

## 2022-10-18 HISTORY — PX: SHOULDER ARTHROSCOPY WITH SUBACROMIAL DECOMPRESSION AND OPEN ROTATOR C: SHX5688

## 2022-10-18 SURGERY — SHOULDER ARTHROSCOPY WITH SUBACROMIAL DECOMPRESSION AND OPEN ROTATOR CUFF REPAIR, OPEN BICEPS TENDON REPAIR
Anesthesia: General | Laterality: Left

## 2022-10-18 MED ORDER — ONDANSETRON HCL 4 MG/2ML IJ SOLN
INTRAMUSCULAR | Status: DC | PRN
  Administered 2022-10-18: 4 mg via INTRAVENOUS

## 2022-10-18 MED ORDER — RINGERS IRRIGATION IR SOLN
Status: DC | PRN
  Administered 2022-10-18 (×4): 6000 mL
  Administered 2022-10-18: 12000 mL
  Administered 2022-10-18: 6000 mL

## 2022-10-18 MED ORDER — LACTATED RINGERS IV SOLN
INTRAVENOUS | Status: DC | PRN
  Administered 2022-10-18: 9003 mL

## 2022-10-18 MED ORDER — OXYCODONE HCL 5 MG PO TABS
5.0000 mg | ORAL_TABLET | Freq: Once | ORAL | Status: AC | PRN
  Administered 2022-10-18: 5 mg via ORAL

## 2022-10-18 MED ORDER — VASOPRESSIN 20 UNIT/ML IV SOLN
INTRAVENOUS | Status: AC
  Filled 2022-10-18: qty 1

## 2022-10-18 MED ORDER — FENTANYL CITRATE (PF) 100 MCG/2ML IJ SOLN: 25.0000 ug | INTRAMUSCULAR | Status: DC | PRN

## 2022-10-18 MED ORDER — ROCURONIUM BROMIDE 100 MG/10ML IV SOLN
INTRAVENOUS | Status: DC | PRN
  Administered 2022-10-18: 20 mg via INTRAVENOUS
  Administered 2022-10-18: 70 mg via INTRAVENOUS
  Administered 2022-10-18: 30 mg via INTRAVENOUS

## 2022-10-18 MED ORDER — FENTANYL CITRATE PF 50 MCG/ML IJ SOSY
50.0000 ug | PREFILLED_SYRINGE | INTRAMUSCULAR | Status: DC | PRN
  Administered 2022-10-18: 50 ug via INTRAVENOUS

## 2022-10-18 MED ORDER — OXYCODONE HCL 5 MG PO TABS
ORAL_TABLET | ORAL | Status: AC
  Filled 2022-10-18: qty 1

## 2022-10-18 MED ORDER — BUPIVACAINE LIPOSOME 1.3 % IJ SUSP: 20.0000 mL | Freq: Once | INTRAMUSCULAR | Status: DC

## 2022-10-18 MED ORDER — ACETAMINOPHEN 500 MG PO TABS: 1000.0000 mg | ORAL_TABLET | Freq: Three times a day (TID) | ORAL | 2 refills | Status: AC

## 2022-10-18 MED ORDER — DEXAMETHASONE SODIUM PHOSPHATE 10 MG/ML IJ SOLN
INTRAMUSCULAR | Status: DC | PRN
  Administered 2022-10-18: 10 mg via INTRAVENOUS

## 2022-10-18 MED ORDER — LIDOCAINE HCL (CARDIAC) PF 100 MG/5ML IV SOSY
PREFILLED_SYRINGE | INTRAVENOUS | Status: DC | PRN
  Administered 2022-10-18: 50 mg via INTRAVENOUS

## 2022-10-18 MED ORDER — BUPIVACAINE HCL (PF) 0.5 % IJ SOLN
INTRAMUSCULAR | Status: DC | PRN
  Administered 2022-10-18 (×2): 10 mL via PERINEURAL

## 2022-10-18 MED ORDER — PROPOFOL 1000 MG/100ML IV EMUL
INTRAVENOUS | Status: AC
  Filled 2022-10-18: qty 100

## 2022-10-18 MED ORDER — OXYCODONE HCL 5 MG PO TABS: 5.0000 mg | ORAL_TABLET | ORAL | 0 refills | Status: AC | PRN

## 2022-10-18 MED ORDER — OXYCODONE HCL 5 MG/5ML PO SOLN: 5.0000 mg | Freq: Once | ORAL | Status: AC | PRN

## 2022-10-18 MED ORDER — BUPIVACAINE HCL (PF) 0.5 % IJ SOLN
INTRAMUSCULAR | Status: AC
  Filled 2022-10-18: qty 30

## 2022-10-18 MED ORDER — ASPIRIN 325 MG PO TBEC: 325.0000 mg | DELAYED_RELEASE_TABLET | Freq: Every day | ORAL | 0 refills | Status: AC

## 2022-10-18 MED ORDER — CEFAZOLIN SODIUM-DEXTROSE 2-4 GM/100ML-% IV SOLN
INTRAVENOUS | Status: AC
  Filled 2022-10-18: qty 100

## 2022-10-18 MED ORDER — EPINEPHRINE PF 1 MG/ML IJ SOLN
INTRAMUSCULAR | Status: AC
  Filled 2022-10-18: qty 4

## 2022-10-18 MED ORDER — SUGAMMADEX SODIUM 200 MG/2ML IV SOLN
INTRAVENOUS | Status: DC | PRN
  Administered 2022-10-18: 200 mg via INTRAVENOUS

## 2022-10-18 MED ORDER — CHLORHEXIDINE GLUCONATE 0.12 % MT SOLN
OROMUCOSAL | Status: AC
  Filled 2022-10-18: qty 15

## 2022-10-18 MED ORDER — MIDAZOLAM HCL 2 MG/2ML IJ SOLN
1.0000 mg | INTRAMUSCULAR | Status: DC | PRN
  Administered 2022-10-18: 1 mg via INTRAVENOUS

## 2022-10-18 MED ORDER — PROPOFOL 10 MG/ML IV BOLUS
INTRAVENOUS | Status: DC | PRN
  Administered 2022-10-18: 150 mg via INTRAVENOUS

## 2022-10-18 MED ORDER — ONDANSETRON 4 MG PO TBDP: 4.0000 mg | ORAL_TABLET | Freq: Three times a day (TID) | ORAL | 0 refills | Status: DC | PRN

## 2022-10-18 MED ORDER — MIDAZOLAM HCL 2 MG/2ML IJ SOLN
INTRAMUSCULAR | Status: AC
  Filled 2022-10-18: qty 2

## 2022-10-18 MED ORDER — LIDOCAINE HCL 2 % IJ SOLN
0.1000 mL | Freq: Once | INTRAMUSCULAR | Status: DC
  Filled 2022-10-18: qty 10

## 2022-10-18 MED ORDER — FENTANYL CITRATE (PF) 100 MCG/2ML IJ SOLN
INTRAMUSCULAR | Status: AC
  Filled 2022-10-18: qty 2

## 2022-10-18 MED ORDER — LACTATED RINGERS IV SOLN: INTRAVENOUS | Status: DC | PRN

## 2022-10-18 SURGICAL SUPPLY — 86 items
ADAPTER IRRIG TUBE 2 SPIKE SOL (ADAPTER) ×2 IMPLANT
ADH SKN CLS APL DERMABOND .7 (GAUZE/BANDAGES/DRESSINGS)
ADPR TBG 2 SPK PMP STRL ASCP (ADAPTER) ×2
ANCH SUT 2 SWLK 19.1 CLS EYLT (Anchor) ×2 IMPLANT
ANCH SUT 2.9 PUSHLOCK ANCH (Orthopedic Implant) ×1 IMPLANT
ANCH SUT 2X2.3 2 STRN TPE (Anchor) ×2 IMPLANT
ANCH SUT FBRTAPE 1.3X2.6X1.7 (Anchor) ×1 IMPLANT
ANCHOR ICONIX SPEED 2.0 TAPE (Anchor) IMPLANT
ANCHOR SUT FBRTK 2.6 SOFT 1.7 (Anchor) IMPLANT
ANCHOR SWIVELOCK BIO 4.75X19.1 (Anchor) ×1 IMPLANT
APL PRP STRL LF DISP 70% ISPRP (MISCELLANEOUS) ×1
BLADE SHAVER 4.5X7 STR FR (MISCELLANEOUS) ×1 IMPLANT
BNDG ADH 2 X3.75 FABRIC TAN LF (GAUZE/BANDAGES/DRESSINGS) ×1 IMPLANT
BNDG ADH XL 3.75X2 STRCH LF (GAUZE/BANDAGES/DRESSINGS) ×1
BUR BR 5.5 WIDE MOUTH (BURR) IMPLANT
CANNULA PART THRD DISP 5.75X7 (CANNULA) IMPLANT
CANNULA PARTIAL THREAD 2X7 (CANNULA) IMPLANT
CANNULA TWIST IN 8.25X7CM (CANNULA) IMPLANT
CANNULA TWIST IN 8.25X9CM (CANNULA) IMPLANT
CHLORAPREP W/TINT 26 (MISCELLANEOUS) ×1 IMPLANT
COOLER POLAR GLACIER W/PUMP (MISCELLANEOUS) ×1 IMPLANT
DERMABOND ADVANCED .7 DNX12 (GAUZE/BANDAGES/DRESSINGS) IMPLANT
DEVICE SUCT BLK HOLE OR FLOOR (MISCELLANEOUS) ×1 IMPLANT
DRAPE 3/4 80X56 (DRAPES) ×1 IMPLANT
DRAPE INCISE IOBAN 66X45 STRL (DRAPES) ×1 IMPLANT
DRAPE U-SHAPE 47X51 STRL (DRAPES) ×2 IMPLANT
DRSG TEGADERM 4X4.75 (GAUZE/BANDAGES/DRESSINGS) ×3 IMPLANT
ELECT REM PT RETURN 9FT ADLT (ELECTROSURGICAL) ×1
ELECTRODE REM PT RTRN 9FT ADLT (ELECTROSURGICAL) ×1 IMPLANT
GAUZE PAD ABD 8X10 STRL (GAUZE/BANDAGES/DRESSINGS) IMPLANT
GAUZE SPONGE 4X4 12PLY STRL (GAUZE/BANDAGES/DRESSINGS) ×1 IMPLANT
GAUZE XEROFORM 1X8 LF (GAUZE/BANDAGES/DRESSINGS) ×1 IMPLANT
GLOVE BIO SURGEON STRL SZ7.5 (GLOVE) ×1 IMPLANT
GLOVE BIOGEL PI IND STRL 8 (GLOVE) ×2 IMPLANT
GLOVE SURG ORTHO 8.0 STRL STRW (GLOVE) ×1 IMPLANT
GLOVE SURG SYN 8.0 (GLOVE) ×1 IMPLANT
GLOVE SURG SYN 8.0 PF PI (GLOVE) ×1 IMPLANT
GOWN STRL REUS W/ TWL LRG LVL3 (GOWN DISPOSABLE) ×2 IMPLANT
GOWN STRL REUS W/TWL LRG LVL3 (GOWN DISPOSABLE) ×2
GOWN STRL REUS W/TWL XL LVL4 (GOWN DISPOSABLE) ×1 IMPLANT
IV LACTATED RINGER IRRG 3000ML (IV SOLUTION) ×12
IV LR IRRIG 3000ML ARTHROMATIC (IV SOLUTION) ×4 IMPLANT
KIT CORKSCREW KNTLS 3.9 S/T/P (INSTRUMENTS) IMPLANT
KIT STABILIZATION SHOULDER (MISCELLANEOUS) ×1 IMPLANT
KIT SUTURETAK 3.0 INSERT PERC (KITS) IMPLANT
KIT TURNOVER KIT A (KITS) ×1 IMPLANT
MANIFOLD NEPTUNE II (INSTRUMENTS) ×2 IMPLANT
MASK FACE SPIDER DISP (MASK) ×1 IMPLANT
MAT ABSORB  FLUID 56X50 GRAY (MISCELLANEOUS) ×2
MAT ABSORB FLUID 56X50 GRAY (MISCELLANEOUS) ×2 IMPLANT
NDL MAYO CATGUT SZ5 (NEEDLE)
NDL SAFETY ECLIP 18X1.5 (MISCELLANEOUS) ×1 IMPLANT
NDL SCORPION MULTI FIRE (NEEDLE) IMPLANT
NDL SUT 5 .5 CRC TPR PNT MAYO (NEEDLE) IMPLANT
NEEDLE SCORPION MULTI FIRE (NEEDLE) IMPLANT
PACK ARTHROSCOPY SHOULDER (MISCELLANEOUS) ×1 IMPLANT
PAD ARMBOARD 7.5X6 YLW CONV (MISCELLANEOUS) ×1 IMPLANT
PAD WRAPON POLAR SHDR XLG (MISCELLANEOUS) ×1 IMPLANT
PASSER SUT FIRSTPASS SELF (INSTRUMENTS) ×1 IMPLANT
PASSER SUT SWIFTSTITCH HIP CRT (INSTRUMENTS) ×1 IMPLANT
SHAVER BLADE BONE CUTTER  5.5 (BLADE)
SHAVER BLADE BONE CUTTER 5.5 (BLADE) ×1 IMPLANT
SLEEVE REMOTE CONTROL 5X12 (DRAPES) IMPLANT
SLING ULTRA II M (MISCELLANEOUS) ×1 IMPLANT
SPONGE T-LAP 18X18 ~~LOC~~+RFID (SPONGE) ×1 IMPLANT
STAPLER SKIN PROX 35W (STAPLE) IMPLANT
STRAP SAFETY 5IN WIDE (MISCELLANEOUS) ×1 IMPLANT
SUT ETHILON 3-0 (SUTURE) ×1 IMPLANT
SUT LASSO 90 DEG CVD (SUTURE) IMPLANT
SUT LASSO 90 DEG SD STR (SUTURE) IMPLANT
SUT MNCRL 4-0 (SUTURE)
SUT MNCRL 4-0 27XMFL (SUTURE)
SUT PROLENE 0 CT 2 (SUTURE) IMPLANT
SUT VIC AB 0 CT1 36 (SUTURE) IMPLANT
SUT VIC AB 2-0 CT2 27 (SUTURE) IMPLANT
SUTURE MNCRL 4-0 27XMF (SUTURE) IMPLANT
SYSTEM IMPL TENODESIS LNT 2.9 (Orthopedic Implant) IMPLANT
TAPE CLOTH 3X10 WHT NS LF (GAUZE/BANDAGES/DRESSINGS) ×1 IMPLANT
TAPE MICROFOAM 4IN (TAPE) ×1 IMPLANT
TRAP FLUID SMOKE EVACUATOR (MISCELLANEOUS) ×1 IMPLANT
TUBING CONNECTING 10 (TUBING) IMPLANT
TUBING INFLOW SET DBFLO PUMP (TUBING) ×1 IMPLANT
TUBING OUTFLOW SET DBLFO PUMP (TUBING) ×1 IMPLANT
WAND WEREWOLF FLOW 90D (MISCELLANEOUS) ×1 IMPLANT
WATER STERILE IRR 500ML POUR (IV SOLUTION) ×1 IMPLANT
WRAPON POLAR PAD SHDR XLG (MISCELLANEOUS) ×1

## 2022-10-18 NOTE — Anesthesia Procedure Notes (Signed)
Anesthesia Regional Block: Interscalene brachial plexus block   Pre-Anesthetic Checklist: , timeout performed,  Correct Patient, Correct Site, Correct Laterality,  Correct Procedure, Correct Position, site marked,  Risks and benefits discussed,  Surgical consent,  Pre-op evaluation,  At surgeon's request and post-op pain management  Laterality: Upper and Left  Prep: chloraprep       Needles:  Injection technique: Single-shot  Needle Type: Stimiplex     Needle Length: 9cm  Needle Gauge: 22     Additional Needles:   Procedures:,,,, ultrasound used (permanent image in chart),,    Narrative:  Start time: 10/18/2022 7:47 AM End time: 10/18/2022 7:49 AM Injection made incrementally with aspirations every 5 mL.  Performed by: Personally  Anesthesiologist: Sanyia Dini, Cleda Mccreedy, MD  Additional Notes: Patient consented for risk and benefits of nerve block including but not limited to nerve damage, Horner's syndrome, failed block, bleeding and infection.  Patient voiced understanding.  Functioning IV was confirmed and monitors were applied.  Timeout done prior to procedure and prior to any sedation being given to the patient.  Patient confirmed procedure site prior to any sedation given to the patient.  A 51mm 22ga Stimuplex needle was used. Sterile prep,hand hygiene and sterile gloves were used.  Minimal sedation used for procedure.  No paresthesia endorsed by patient during the procedure.  Negative aspiration and negative test dose prior to incremental administration of local anesthetic. The patient tolerated the procedure well with no immediate complications.

## 2022-10-18 NOTE — Anesthesia Preprocedure Evaluation (Signed)
Anesthesia Evaluation  Patient identified by MRN, date of birth, ID band Patient awake    Reviewed: Allergy & Precautions, NPO status , Patient's Chart, lab work & pertinent test results  History of Anesthesia Complications Negative for: history of anesthetic complications  Airway Mallampati: III  TM Distance: <3 FB Neck ROM: full    Dental  (+) Chipped   Pulmonary neg shortness of breath, sleep apnea , former smoker   Pulmonary exam normal        Cardiovascular hypertension, (-) angina (-) Past MI and (-) DOE negative cardio ROS Normal cardiovascular exam     Neuro/Psych  Neuromuscular disease  negative psych ROS   GI/Hepatic Neg liver ROS,GERD  Controlled,,  Endo/Other  negative endocrine ROS    Renal/GU      Musculoskeletal   Abdominal   Peds  Hematology negative hematology ROS (+)   Anesthesia Other Findings Past Medical History: No date: Allergy     Comment:  Information from [facility] No date: Arthritis     Comment:  Information from [facility] No date: GERD (gastroesophageal reflux disease)     Comment:  Information from [facility] No date: Hypercholesterolemia     Comment:  Information from [facility] No date: Hypertension     Comment:  Information from [facility] No date: Pre-diabetes  Past Surgical History: 03/02/2013: ORIF METACARPAL FRACTURE; Right     Comment:  Information from [facility]; 5th               metacarpal 2014: rotator cuff repair; Right  BMI    Body Mass Index: 34.17 kg/m      Reproductive/Obstetrics negative OB ROS                             Anesthesia Physical Anesthesia Plan  ASA: 3  Anesthesia Plan: General ETT   Post-op Pain Management: Regional block*   Induction: Intravenous  PONV Risk Score and Plan: Ondansetron,  Dexamethasone, Midazolam and Treatment may vary due to age or medical condition  Airway Management Planned: Oral ETT  Additional Equipment:   Intra-op Plan:   Post-operative Plan: Extubation in OR  Informed Consent: I have reviewed the patients History and Physical, chart, labs and discussed the procedure including the risks, benefits and alternatives for the proposed anesthesia with the patient or authorized representative who has indicated his/her understanding and acceptance.     Dental Advisory Given  Plan Discussed with: Anesthesiologist, CRNA and Surgeon  Anesthesia Plan Comments: (Patient consented for risks of anesthesia including but not limited to:  - adverse reactions to medications - damage to eyes, teeth, lips or other oral mucosa - nerve damage due to positioning  - sore throat or hoarseness - Damage to heart, brain, nerves, lungs, other parts of body or loss of life  Patient voiced understanding.)       Anesthesia Quick Evaluation

## 2022-10-18 NOTE — Anesthesia Postprocedure Evaluation (Signed)
Anesthesia Post Note  Patient: Jason Dickson  Procedure(s) Performed: Left shoulder arthroscopic rotator cuff repair (supraspinatus and subscapularis), subacromial decompression, distal clavicle excision and biceps tenodesis (Left)  Patient location during evaluation: PACU Anesthesia Type: General Level of consciousness: awake and alert Pain management: pain level controlled Vital Signs Assessment: post-procedure vital signs reviewed and stable Respiratory status: spontaneous breathing, nonlabored ventilation, respiratory function stable and patient connected to nasal cannula oxygen Cardiovascular status: blood pressure returned to baseline and stable Postop Assessment: no apparent nausea or vomiting Anesthetic complications: no   No notable events documented.   Last Vitals:  Vitals:   10/18/22 1145 10/18/22 1204  BP: (!) 143/74 138/68  Pulse: 87 81  Resp: 20 18  Temp: (!) 36.1 C (!) 36.2 C  SpO2: 100% 98%    Last Pain:  Vitals:   10/18/22 1204  TempSrc: Temporal  PainSc: 0-No pain                 Cleda Mccreedy Alam Guterrez

## 2022-10-18 NOTE — Anesthesia Procedure Notes (Signed)
Procedure Name: Intubation Date/Time: 10/18/2022 8:17 AM  Performed by: Philbert Riser, CRNAPre-anesthesia Checklist: Patient identified, Patient being monitored, Timeout performed, Emergency Drugs available and Suction available Patient Re-evaluated:Patient Re-evaluated prior to induction Oxygen Delivery Method: Circle system utilized Preoxygenation: Pre-oxygenation with 100% oxygen Induction Type: IV induction Ventilation: Mask ventilation without difficulty Laryngoscope Size: McGraph and 4 Grade View: Grade I Tube type: Oral Tube size: 7.5 mm Number of attempts: 1 Airway Equipment and Method: Stylet Placement Confirmation: ETT inserted through vocal cords under direct vision, positive ETCO2 and breath sounds checked- equal and bilateral Secured at: 21 cm Tube secured with: Tape Dental Injury: Teeth and Oropharynx as per pre-operative assessment
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
## 2022-10-18 NOTE — Discharge Instructions (Addendum)
Post-Op Instructions - Rotator Cuff Repair  1. Bracing: You will wear a shoulder immobilizer or sling for 6 weeks.   2. Driving: No driving for 3 weeks post-op. When driving, do not wear the immobilizer. Ideally, we recommend no driving for 6 weeks while sling is in place as one arm will be immobilized.   3. Activity: No active lifting for 2 months. Wrist, hand, and elbow motion only. Avoid lifting the upper arm away from the body except for hygiene. You are permitted to bend and straighten the elbow passively only (no active elbow motion). You may use your hand and wrist for typing, writing, and managing utensils (cutting food). Do not lift more than a coffee cup for 8 weeks.  When sleeping or resting, inclined positions (recliner chair or wedge pillow) and a pillow under the forearm for support may provide better comfort for up to 4 weeks.  Avoid long distance travel for 4 weeks.  Return to normal activities after rotator cuff repair repair normally takes 6 months on average. If rehab goes very well, may be able to do most activities at 4 months, except overhead or contact sports.  4. Physical Therapy: Begins 3-4 days after surgery, and proceed 1 time per week for the first 6 weeks, then 1-2 times per week from weeks 6-20 post-op.  5. Medications:  - You will be provided a prescription for narcotic pain medicine. After surgery, take 1-2 narcotic tablets every 4 hours if needed for severe pain.  - A prescription for anti-nausea medication will be provided in case the narcotic medicine causes nausea - take 1 tablet every 6 hours only if nauseated.   - Take tylenol 1000 mg (2 Extra Strength tablets or 3 regular strength) every 8 hours for pain.  May decrease or stop tylenol 5 days after surgery if you are having minimal pain. - Take ASA 325mg /day x 2 weeks to help prevent DVTs/PEs (blood clots).  - DO NOT take ANY nonsteroidal anti-inflammatory pain medications (Advil, Motrin, Ibuprofen, Aleve,  Naproxen, or Naprosyn). These medicines can inhibit healing of your shoulder repair.    If you are taking prescription medication for anxiety, depression, insomnia, muscle spasm, chronic pain, or for attention deficit disorder, you are advised that you are at a higher risk of adverse effects with use of narcotics post-op, including narcotic addiction/dependence, depressed breathing, death. If you use non-prescribed substances: alcohol, marijuana, cocaine, heroin, methamphetamines, etc., you are at a higher risk of adverse effects with use of narcotics post-op, including narcotic addiction/dependence, depressed breathing, death. You are advised that taking > 50 morphine milligram equivalents (MME) of narcotic pain medication per day results in twice the risk of overdose or death. For your prescription provided: oxycodone 5 mg - taking more than 6 tablets per day would result in > 50 morphine milligram equivalents (MME) of narcotic pain medication. Be advised that we will prescribe narcotics short-term, for acute post-operative pain only - 3 weeks for major operations such as shoulder repair/reconstruction surgeries.   6. Post-Op Appointment:  Your first post-op appointment will be 10-14 days post-op.  7. Work or School: For most, but not all procedures, we advise staying out of work or school for at least 1 to 2 weeks in order to recover from the stress of surgery and to allow time for healing.   If you need a work or school note this can be provided.   8. Smoking: If you are a smoker, you need to refrain from smoking in  the postoperative period. The nicotine in cigarettes will inhibit healing of your shoulder repair and decrease the chance of successful repair. Similarly, nicotine containing products (gum, patches) should be avoided.   Post-operative Brace: Apply and remove the brace you received as you were instructed to at the time of fitting and as described in detail as the brace's  instructions for use indicate.  Wear the brace for the period of time prescribed by your physician.  The brace can be cleaned with soap and water and allowed to air dry only.  Should the brace result in increased pain, decreased feeling (numbness/tingling), increased swelling or an overall worsening of your medical condition, please contact your doctor immediately.  If an emergency situation occurs as a result of wearing the brace after normal business hours, please dial 911 and seek immediate medical attention.  Let your doctor know if you have any further questions about the brace issued to you. Refer to the shoulder sling instructions for use if you have any questions regarding the correct fit of your shoulder sling.  [facility] for Troubleshooting: [phone]  Video that illustrates how to properly use a shoulder sling: "Instructions for Proper Use of an Orthopaedic Sling" http://bass.com/https://www.youtube.com/watch?v=AHZpn_Xo45w   AMBULATORY SURGERY  DISCHARGE INSTRUCTIONS  The drugs that you were given will stay in your system until tomorrow so for the next 24 hours you should not:  Drive an automobile Make any legal decisions Drink any alcoholic beverage  You may resume regular meals tomorrow.  Today it is better to start with liquids and gradually work up to solid foods.  You may eat anything you prefer, but it is better to start with liquids, then soup and crackers, and gradually work up to solid foods.  Please notify your doctor immediately if you have any unusual bleeding, trouble breathing, redness and pain at the surgery site, drainage, fever, or pain not relieved by medication.  Additional Instructions:  Please contact your physician with any problems or Same Day Surgery at [phone], Monday through Friday 6 am to 4 pm, or [place] [facility] Main number at [phone].    SHOULDER SLING IMMOBILIZER   VIDEO Slingshot 2 Shoulder Brace Application - YouTube  ---https://www.porter.info/https://www.youtube.com/watch?v=RIUeiJxalMg  INSTRUCTIONS While supporting the injured arm, slide the forearm into the sling. Wrap the adjustable shoulder strap around the neck and shoulders and attach the strap end to the sling using  the "alligator strap tab."  Adjust the shoulder strap to the required length. Position the shoulder pad behind the neck. To secure the shoulder pad location (optional), pull the shoulder strap away from the shoulder pad, unfold the hook material on the top of the pad, then press the shoulder strap back onto the hook material to secure the pad in place. Attach the closure strap across the open top of the sling. Position the strap so that it holds the arm securely in the sling. Next, attach the thumb strap to the open end of the sling between the thumb and fingers. After sling has been fit, it may be easily removed and reapplied using the quick release buckle on shoulder strap. If a neutral pillow or 15 abduction pillow is included, place the pillow at the waistline. Attach the sling to the pillow, lining up hook material on the pillow with the loop on sling. Adjust the waist strap to fit.  If waist strap is too long, cut it to fit. Use the small piece of double sided hook material (located on top  of the pillow) to secure the strap end. Place the double sided hook material on the inside of the cut strap end and secure it to the waist strap.     If no pillow is included, attach the waist strap to the sling and adjust to fit.    Washing Instructions: Straps and sling must be removed and cleaned regularly depending on your activity level and perspiration. Hand wash straps and sling in cold water with mild detergent, rinse, air dry      POLAR CARE INFORMATION  MassAdvertisement.it  How to use [facility] Polar Care [facility] Therapy System?  YouTube   ShippingScam.co.uk  OPERATING INSTRUCTIONS  Start the product With dry hands, connect the  transformer to the electrical connection located on the top of the cooler. Next, plug the transformer into an appropriate electrical outlet. The unit will automatically start running at this point.  To stop the pump, disconnect electrical power.  Unplug to stop the product when not in use. Unplugging the Polar Care unit turns it off. Always unplug immediately after use. Never leave it plugged in while unattended. Remove pad.    FIRST ADD WATER TO FILL LINE, THEN ICE---Replace ice when existing ice is almost melted  1 Discuss Treatment with your Licensed Health Care Practitioner and Use Only as Prescribed 2 Apply Insulation Barrier & Cold Therapy Pad 3 Check for Moisture 4 Inspect Skin Regularly  Tips and Trouble Shooting Usage Tips 1. Use cubed or chunked ice for optimal performance. 2. It is recommended to drain the Pad between uses. To drain the pad, hold the Pad upright with the hose pointed toward the ground. Depress the black plunger and allow water to drain out. 3. You may disconnect the Pad from the unit without removing the pad from the affected area by depressing the silver tabs on the hose coupling and gently pulling the hoses apart. The Pad and unit will seal itself and will not leak. Note: Some dripping during release is normal. 4. DO NOT RUN PUMP WITHOUT WATER! The pump in this unit is designed to run with water. Running the unit without water will cause permanent damage to the pump. 5. Unplug unit before removing lid.  TROUBLESHOOTING GUIDE Pump not running, Water not flowing to the pad, Pad is not getting cold 1. Make sure the transformer is plugged into the wall outlet. 2. Confirm that the ice and water are filled to the indicated levels. 3. Make sure there are no kinks in the pad. 4. Gently pull on the blue tube to make sure the tube/pad junction is straight. 5. Remove the pad from the treatment site and ll it while the pad is lying at; then reapply. 6. Confirm that the pad  couplings are securely attached to the unit. Listen for the double clicks (Figure 1) to confirm the pad couplings are securely attached.  Leaks    Note: Some condensation on the lines, controller, and pads is unavoidable, especially in warmer climates. 1. If using a [facility] Polar Care Cold Therapy unit with a detachable Cold Therapy Pad, and a leak exists (other than condensation on the lines) disconnect the pad couplings. Make sure the silver tabs on the couplings are depressed before reconnecting the pad to the pump hose; then confirm both sides of the coupling are properly clicked in. 2. If the coupling continues to leak or a leak is detected in the pad itself, stop using it and call [employer] Customer Care at [phone].  Cleaning  After use, empty and dry the unit with a soft cloth. Warm water and mild detergent may be used occasionally to clean the pump and tubes.  WARNING: The Polar Care Cube can be cold enough to cause serious injury, including full skin necrosis. Follow these Operating Instructions, and carefully read the Product Insert (see pouch on side of unit) and the Cold Therapy Pad Fitting Instructions (provided with each Cold Therapy Pad) prior to use.       Interscalene Nerve Block (ISNB) Discharge Instructions    For your surgery you have received an Interscalene Nerve Block. Nerve Blocks affect many types of nerves, including nerves that control movement, pain and normal sensation.  You may experience feelings such as numbness, tingling, heaviness, weakness or the inability to move your arm or the feeling or sensation that your arm has "fallen asleep". A nerve block can last for 2 - 36 hours or more depending on the medication used.  Usually the weakness wears off first.  The tingling and heaviness usually wear off next.  Finally you may start to notice pain.  Keep in mind that this may occur in any order.  once a nerve block starts to wear off it is usually completely gone within  60 minutes. ISNB may cause mild shortness of breath, a hoarse voice, blurry vision, unequal pupils, or drooping of the face on the same side as the nerve block.  These symptoms will usually go away within 12 hours.  Very rarely the procedure itself can cause mild seizures. If needed, your surgeon will give you a prescription for pain medication.  It will take about 60 minutes for the oral pain medication to become fully effective.  So, it is recommended that you start taking this medication before the nerve block first begins to wear off, or when you first begin to feel discomfort. Keep in mind that nerve blocks often wear off in the middle of the night.   If you are going to bed and the block has not started to wear off or you have not started to have any discomfort, consider setting an alarm for 2 to 3 hours, so you can assess your block.  If you notice the block is wearing off or you are starting to have discomfort, you can take your pain medication. Take your pain medication only as prescribed.  Pain medication can cause sedation and decrease your breathing if you take more than you need for the level of pain that you have. Nausea is a common side effect of many pain medications.  You may want to eat something before taking your pain medicine to prevent nausea. After an Interscalene nerve block, you cannot feel pain, pressure or extremes in temperature in the effected arm.  Because your arm is numb it is at an increased risk for injury.  To decrease the possibility of injury, please practice the following:  While you are awake change the position of your arm frequently to prevent too much pressure on any one area for prolonged periods of time.  If you have a cast or tight dressing, check the color or your fingers every couple of hours.  Call your surgeon with the appearance of any discoloration (white or blue). If you are given a sling to wear before you go home, please wear it  at all times until the  block has completely worn off.  Do not get up at night without your sling. If you experience any problems or concerns, please  contact your surgeon's office. If you experience severe or prolonged shortness of breath go to the nearest emergency department.

## 2022-10-18 NOTE — H&P (Signed)
Paper H&P to be scanned into permanent record. H&P reviewed. No significant changes noted.  

## 2022-10-18 NOTE — Progress Notes (Signed)
Patient does not desire to use Polar Care device for cold therapy. Dr. Allena Katz notified, request that patient be encouraged to use device but if refused may use ice pack.  Information given to patient and family, patient refused polar care and request ice pack.

## 2022-10-18 NOTE — Transfer of Care (Signed)
Immediate Anesthesia Transfer of Care Note  Patient: Jason Dickson  Procedure(s) Performed: Left shoulder arthroscopic rotator cuff repair (supraspinatus and subscapularis), subacromial decompression, distal clavicle excision and biceps tenodesis (Left)  Patient Location: PACU  Anesthesia Type:General  Level of Consciousness: drowsy  Airway & Oxygen Therapy: Patient Spontanous Breathing  Post-op Assessment: Report given to RN and Post -op Vital signs reviewed and stable  Post vital signs: Reviewed and stable  Last Vitals:  Vitals Value Taken Time  BP 143/85 10/18/22 1100  Temp 36.3 C 10/18/22 1100  Pulse 87 10/18/22 1104  Resp 16 10/18/22 1104  SpO2 99 % 10/18/22 1104  Vitals shown include unvalidated device data.  Last Pain:  Vitals:   10/18/22 0717  TempSrc: Temporal  PainSc: 4          Complications: No notable events documented.

## 2022-10-21 ENCOUNTER — Encounter: Payer: Self-pay | Admitting: Orthopedic Surgery

## 2022-10-28 ENCOUNTER — Ambulatory Visit
Admission: EM | Admit: 2022-10-28 | Discharge: 2022-10-28 | Disposition: A | Discharge status: Home / Self Care | Payer: No Typology Code available for payment source

## 2022-10-28 ENCOUNTER — Encounter: Payer: Self-pay | Admitting: Emergency Medicine

## 2022-10-28 DIAGNOSIS — J019 Acute sinusitis, unspecified: Secondary | ICD-10-CM | POA: Diagnosis not present

## 2022-10-28 DIAGNOSIS — R051 Acute cough: Secondary | ICD-10-CM | POA: Diagnosis not present

## 2022-10-28 DIAGNOSIS — R0981 Nasal congestion: Secondary | ICD-10-CM

## 2022-10-28 MED ORDER — AMOXICILLIN-POT CLAVULANATE 875-125 MG PO TABS: 1.0000 | ORAL_TABLET | Freq: Two times a day (BID) | ORAL | 0 refills | Status: AC

## 2022-10-28 MED ORDER — PROMETHAZINE-DM 6.25-15 MG/5ML PO SYRP: 5.0000 mL | ORAL_SOLUTION | Freq: Four times a day (QID) | ORAL | 0 refills | Status: DC | PRN

## 2022-10-28 NOTE — Discharge Instructions (Addendum)
-  We discussed that this is most likely viral since your wife is sick with the same symptoms but since he been ill for a couple of weeks we can try an antibiotic to see if it would help.  I also sent cough medicine.  Plenty rest and fluids. - You need to be seen again if you develop a fever, worsening cough or breathing difficulty.

## 2022-10-28 NOTE — ED Triage Notes (Signed)
Pt c/o nasal congestion, cough, post nasal drip, sneezing. Started about 2-3 weeks ago. Denies fever.

## 2022-10-28 NOTE — ED Provider Notes (Signed)
[facility]    CSN: [id number] Arrival date & time: 10/28/22  1119      History   Chief Complaint Chief Complaint  Patient presents with   Nasal Congestion   Cough    HPI Jason Dickson is a 62 y.o. male presenting for 2.5-week history of nasal congestion, sinus pressure, cough, postnasal drainage, sore throat and sneezing.  Patient reports his symptoms of gotten worse instead of better.  His wife is sick with similar symptoms.  He denies any known COVID or flu exposure.  He has been taking OTC meds and says that did not really help him.  He has not had any fevers, shortness of breath or breathing difficulty, vomiting or diarrhea.  History of hyperlipidemia, hypertension and prediabetes.  HPI  Past Medical History:  Diagnosis Date   Allergy    Information from [facility] from [place]   GERD (gastroesophageal reflux disease)    Information from [place]   Hypercholesterolemia    Information from [place]   Hypertension    Information from [place]   Pre-diabetes     Patient Active Problem List   Diagnosis Date Noted   Pure hypercholesterolemia 12/28/2020   History of 2019 novel coronavirus disease (COVID-19) 08/04/2020   Chronic lumbar radiculopathy 07/31/2018   Benign prostatic hyperplasia with weak urinary stream 10/15/2016   Essential hypertension 10/15/2016   Other male erectile dysfunction 10/15/2016   Borderline diabetes mellitus 04/20/2015   Mild obesity 10/19/2014    Past Surgical History:  Procedure Laterality Date   ORIF METACARPAL FRACTURE Right 03/02/2013   Information from [place]; 5th metacarpal   rotator cuff repair Right 2014   SHOULDER ARTHROSCOPY WITH SUBACROMIAL DECOMPRESSION AND OPEN ROTATOR C Left 10/18/2022   Procedure: Left shoulder arthroscopic rotator cuff repair  (supraspinatus and subscapularis), subacromial decompression, distal clavicle excision and biceps tenodesis;  Surgeon: Leim Fabry, MD;  Location: [facility] ORS;  Service: Orthopedics;  Laterality: Left;       Home Medications    Prior to Admission medications   Medication Sig Start Date End Date Taking? Authorizing Provider  amoxicillin-clavulanate (AUGMENTIN) 875-125 MG tablet Take 1 tablet by mouth every 12 (twelve) hours for 7 days. 10/28/22 11/04/22 Yes Danton Clap, PA-C  Ascorbic Acid (VITAMIN C) 1000 MG tablet Take 1,000 mg by mouth daily.   Yes [provider]  Cholecalciferol (VITAMIN D) 125 MCG (5000 UT) CAPS Take by mouth.   Yes [provider]  lisinopril (PRINIVIL,ZESTRIL) 5 MG tablet Take 5 mg by mouth daily.   Yes [provider]  loratadine (CLARITIN) 10 MG tablet Take 10 mg by mouth daily.   Yes [provider]  Magnesium 500 MG TABS Take by mouth.   Yes [provider]  omeprazole (PRILOSEC) 20 MG capsule Take 20 mg by mouth daily.   Yes [provider]  oxyCODONE (ROXICODONE) 5 MG immediate release tablet Take 1-2 tablets (5-10 mg total) by mouth every 4 (four) hours as needed (pain). 10/18/22 10/18/23 Yes Leim Fabry, MD  promethazine-dextromethorphan (PROMETHAZINE-DM) 6.25-15 MG/5ML syrup Take 5 mLs by mouth 4 (four) times daily as needed. 10/28/22  Yes Danton Clap, PA-C  tamsulosin (FLOMAX) 0.4 MG CAPS capsule Take 0.4 mg by mouth.   Yes [provider]  vitamin B-12 (CYANOCOBALAMIN) 100 MCG tablet Take 100 mcg by mouth daily.  Yes [provider]  acetaminophen (TYLENOL) 500 MG tablet Take 2 tablets (1,000 mg total) by mouth every 8 (eight) hours. 10/18/22 10/18/23  Leim Fabry, MD  aspirin EC 325 MG tablet Take 1 tablet (325 mg total) by mouth daily for 14 days. 10/18/22 11/01/22  Leim Fabry, MD  ipratropium (ATROVENT) 0.06 % nasal spray Place 2 sprays into both nostrils 4 (four) times daily.  08/24/22   Danton Clap, PA-C  lubiprostone (AMITIZA) 24 MCG capsule Take by mouth.    [provider]  ondansetron (ZOFRAN-ODT) 4 MG disintegrating tablet Take 1 tablet (4 mg total) by mouth every 8 (eight) hours as needed for nausea or vomiting. 10/18/22   Leim Fabry, MD  simvastatin (ZOCOR) 40 MG tablet TAKE 1 TABLET EVERY DAY 03/04/16   Guadalupe Maple, MD  tadalafil (ADCIRCA/CIALIS) 20 MG tablet Take 20 mg by mouth daily as needed for erectile dysfunction.    [provider]    Family History No family history on file.  Social History Social History   Tobacco Use   Smoking status: Former    Packs/day: 1.25    Years: 37.00    Total pack years: 46.25    Types: Cigarettes    Quit date: 02/26/2012    Years since quitting: 10.6   Smokeless tobacco: Never  Vaping Use   Vaping Use: Former  Substance Use Topics   Alcohol use: Not Currently   Drug use: Never     Allergies   Patient has no known allergies.   Review of Systems Review of Systems  Constitutional:  Positive for fatigue. Negative for fever.  HENT:  Positive for congestion, postnasal drip, rhinorrhea, sinus pressure, sinus pain, sneezing and sore throat.   Respiratory:  Positive for cough. Negative for shortness of breath.   Gastrointestinal:  Negative for abdominal pain, diarrhea, nausea and vomiting.  Musculoskeletal:  Negative for myalgias.  Neurological:  Negative for weakness, light-headedness and headaches.  Hematological:  Negative for adenopathy.     Physical Exam Triage Vital Signs ED Triage Vitals  Enc Vitals Group     BP      Pulse      Resp      Temp      Temp src      SpO2      Weight      Height      Head Circumference      Peak Flow      Pain Score      Pain Loc      Pain Edu?      Excl. in [place]?    No data found.  Updated Vital Signs BP 121/76 (BP Location: Right Arm)   Pulse 72   Temp 97.6 F (36.4 C) (Oral)   Resp 16   Ht 5\' 11"  (1.803 m)   Wt 244 lb  14.9 oz (111.1 kg)   SpO2 95%   BMI 34.16 kg/m     Physical Exam Vitals and nursing note reviewed.  Constitutional:      General: He is not in acute distress.    Appearance: Normal appearance. He is well-developed. He is not ill-appearing.  HENT:     Head: Normocephalic and atraumatic.     Right Ear: Tympanic membrane, ear canal and external ear normal.     Left Ear: Tympanic membrane, ear canal and external ear normal.     Nose: Congestion present.     Mouth/Throat:     Mouth:  Mucous membranes are moist.     Pharynx: Oropharynx is clear. Posterior oropharyngeal erythema present.  Eyes:     General: No scleral icterus.    Conjunctiva/sclera: Conjunctivae normal.  Cardiovascular:     Rate and Rhythm: Normal rate and regular rhythm.     Heart sounds: Normal heart sounds.  Pulmonary:     Effort: Pulmonary effort is normal. No respiratory distress.     Breath sounds: Normal breath sounds.  Musculoskeletal:     Cervical back: Neck supple.  Skin:    General: Skin is warm and dry.     Capillary Refill: Capillary refill takes less than 2 seconds.  Neurological:     General: No focal deficit present.     Mental Status: He is alert. Mental status is at baseline.     Motor: No weakness.     Gait: Gait normal.  Psychiatric:        Mood and Affect: Mood normal.        Behavior: Behavior normal.      UC Treatments / Results  Labs (all labs ordered are listed, but only abnormal results are displayed) Labs Reviewed - No data to display  EKG   Radiology No results found.  Procedures Procedures (including critical care time)  Medications Ordered in UC Medications - No data to display  Initial Impression / Assessment and Plan / UC Course  I have reviewed the triage vital signs and the nursing notes.  Pertinent labs & imaging results that were available during my care of the patient were reviewed by me and considered in my medical decision making (see chart for  details).   62 year old male presents for 2-1/2-week history of cough, congestion, sinus pressure, sore throat, postnasal drainage and sneezing.  No improvement in symptoms or worsening.  Vitals normal and stable.  He is overall well-appearing.  Exam ears are clear.  Nasal congestion present and erythema posterior pharynx with clear postnasal drainage.  Chest clear auscultation heart regular rate and rhythm.  Given the fact that his wife has similar symptoms, advised him that he likely has a viral illness.  He is concerned about it getting worse and moving to his chest.  Since he has been sick for 2-1/2 weeks without improvement, will try him on Augmentin.  Also sent Promethazine DM.  Encouraged plenty rest and fluids.  Reviewed return precautions.   Final Clinical Impressions(s) / UC Diagnoses   Final diagnoses:  Acute sinusitis, recurrence not specified, unspecified location  Acute cough  Nasal congestion     Discharge Instructions      -We discussed that this is most likely viral since your wife is sick with the same symptoms but since he been ill for a couple of weeks we can try an antibiotic to see if it would help.  I also sent cough medicine.  Plenty rest and fluids. - You need to be seen again if you develop a fever, worsening cough or breathing difficulty.     ED Prescriptions     Medication Sig Dispense Auth. Provider   amoxicillin-clavulanate (AUGMENTIN) 875-125 MG tablet Take 1 tablet by mouth every 12 (twelve) hours for 7 days. 14 tablet Shirlee Latch, PA-C   promethazine-dextromethorphan (PROMETHAZINE-DM) 6.25-15 MG/5ML syrup Take 5 mLs by mouth 4 (four) times daily as needed. 118 mL Shirlee Latch, PA-C      PDMP not reviewed this encounter.   Shirlee Latch, PA-C 10/28/22 1237

## 2023-02-11 ENCOUNTER — Ambulatory Visit
Admission: RE | Admit: 2023-02-11 | Discharge: 2023-02-11 | Disposition: A | Discharge status: Home / Self Care | Payer: No Typology Code available for payment source | Source: Ambulatory Visit | Attending: Acute Care | Admitting: Acute Care

## 2023-02-11 DIAGNOSIS — Z122 Encounter for screening for malignant neoplasm of respiratory organs: Secondary | ICD-10-CM | POA: Diagnosis present

## 2023-02-11 DIAGNOSIS — Z87891 Personal history of nicotine dependence: Secondary | ICD-10-CM | POA: Insufficient documentation

## 2023-02-12 ENCOUNTER — Other Ambulatory Visit: Payer: Self-pay | Admitting: Acute Care

## 2023-02-12 DIAGNOSIS — Z87891 Personal history of nicotine dependence: Secondary | ICD-10-CM

## 2023-02-12 DIAGNOSIS — Z122 Encounter for screening for malignant neoplasm of respiratory organs: Secondary | ICD-10-CM
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
## 2023-11-27 ENCOUNTER — Ambulatory Visit
Admission: EM | Admit: 2023-11-27 | Discharge: 2023-11-27 | Disposition: A | Discharge status: Home / Self Care | Payer: No Typology Code available for payment source | Attending: Emergency Medicine | Admitting: Emergency Medicine

## 2023-11-27 DIAGNOSIS — J40 Bronchitis, not specified as acute or chronic: Secondary | ICD-10-CM

## 2023-11-27 MED ORDER — PROMETHAZINE-DM 6.25-15 MG/5ML PO SYRP: 5.0000 mL | ORAL_SOLUTION | Freq: Four times a day (QID) | ORAL | 0 refills | Status: DC | PRN

## 2023-11-27 MED ORDER — DOXYCYCLINE HYCLATE 100 MG PO CAPS: 100.0000 mg | ORAL_CAPSULE | Freq: Two times a day (BID) | ORAL | 0 refills | Status: AC

## 2023-11-27 MED ORDER — IPRATROPIUM BROMIDE 0.06 % NA SOLN: 2.0000 | Freq: Four times a day (QID) | NASAL | 0 refills | Status: AC

## 2023-11-27 MED ORDER — BENZONATATE 100 MG PO CAPS: 200.0000 mg | ORAL_CAPSULE | Freq: Three times a day (TID) | ORAL | 0 refills | Status: DC

## 2023-11-27 NOTE — ED Provider Notes (Signed)
[facility]    CSN: [id number] Arrival date & time: 11/27/23  1216      History   Chief Complaint Chief Complaint  Patient presents with   Cough   Facial Pain    HPI Jason Dickson is a 63 y.o. male.   HPI  63 year old male with a past medical history significant for prediabetes, hypertension, hypercholesterolemia, and GERD presents for evaluation of 4 days worth of respiratory symptoms to include runny nose with yellow-green nasal discharge, sneezing, nasal congestion, and a cough that is productive for yellow-green mucus.  He denies any fever, shortness breath, wheezing, sore throat, or ear pain.  No known sick contacts.  Past Medical History:  Diagnosis Date   Allergy    Information from [facility]   Arthritis    Information from [facility]   GERD (gastroesophageal reflux disease)    Information from [facility]   Hypercholesterolemia    Information from [facility]   Hypertension    Information from [facility]   Pre-diabetes     Patient Active Problem List   Diagnosis Date Noted   Pure hypercholesterolemia 12/28/2020   History of 2019 novel coronavirus disease (COVID-19) 08/04/2020   Chronic lumbar radiculopathy 07/31/2018   Benign prostatic hyperplasia with weak urinary stream 10/15/2016   Essential hypertension 10/15/2016   Other male erectile dysfunction 10/15/2016   Borderline diabetes mellitus 04/20/2015   Mild obesity 10/19/2014    Past Surgical History:  Procedure Laterality Date   ORIF METACARPAL FRACTURE Right 03/02/2013   Information from [facility]; 5th metacarpal   rotator cuff repair Right 2014   SHOULDER ARTHROSCOPY WITH SUBACROMIAL DECOMPRESSION AND OPEN ROTATOR C Left 10/18/2022   Procedure: Left shoulder arthroscopic rotator cuff repair (supraspinatus and subscapularis), subacromial decompression, distal clavicle  excision and biceps tenodesis;  Surgeon: Signa Kell, MD;  Location: [facility] OR[facility];  Service: Orthopedics;  Laterality: Left;       Home Medications    Prior to Admission medications   Medication Sig Start Date End Date Taking? Authorizing Provider  Ascorbic Acid (VITAMIN C) 1000 MG tablet Take 1,000 mg by mouth daily.   Yes [provider]  benzonatate (TESSALON) 100 MG capsule Take 2 capsules (200 mg total) by mouth every 8 (eight) hours. 11/27/23  Yes Becky Augusta, NP  Cholecalciferol (VITAMIN D) 125 MCG (5000 UT) CAPS Take by mouth.   Yes [provider]  doxycycline (VIBRAMYCIN) 100 MG capsule Take 1 capsule (100 mg total) by mouth 2 (two) times daily for 7 days. 11/27/23 12/04/23 Yes Becky Augusta, NP  lisinopril (PRINIVIL,ZESTRIL) 5 MG tablet Take 5 mg by mouth daily.   Yes [provider]  loratadine (CLARITIN) 10 MG tablet Take 10 mg by mouth daily.   Yes [provider]  lubiprostone (AMITIZA) 24 MCG capsule Take by mouth.   Yes [provider]  Magnesium 500 MG TABS Take by mouth.   Yes [provider]  simvastatin (ZOCOR) 40 MG tablet TAKE 1 TABLET EVERY DAY 03/04/16  Yes Crissman, Redge Gainer, MD  tadalafil (ADCIRCA/CIALIS) 20 MG tablet Take 20 mg by mouth daily as needed for erectile dysfunction.   Yes [provider]  tamsulosin (FLOMAX) 0.4 MG CAPS capsule Take 0.4 mg by mouth.   Yes [provider]  vitamin B-12 (CYANOCOBALAMIN) 100 MCG tablet Take 100 mcg by mouth daily.   Yes [provider]  ipratropium (ATROVENT)  0.06 % nasal spray Place 2 sprays into both nostrils 4 (four) times daily. 11/27/23   Becky Augusta, NP  omeprazole (PRILOSEC) 20 MG capsule Take 20 mg by mouth daily.    [provider]  ondansetron (ZOFRAN-ODT) 4 MG disintegrating tablet Take 1 tablet (4 mg total) by mouth every 8 (eight) hours as needed for nausea or vomiting. 10/18/22   Signa Kell, MD  promethazine-dextromethorphan  (PROMETHAZINE-DM) 6.25-15 MG/5ML syrup Take 5 mLs by mouth 4 (four) times daily as needed. 11/27/23   Becky Augusta, NP    Family History History reviewed. No pertinent family history.  Social History Social History   Tobacco Use   Smoking status: Former    Current packs/day: 0.00    Average packs/day: 1.3 packs/day for 37.0 years (46.3 ttl pk-yrs)    Types: Cigarettes    Start date: 02/26/1975    Quit date: 02/26/2012    Years since quitting: 11.7   Smokeless tobacco: Never  Vaping Use   Vaping status: Former  Substance Use Topics   Alcohol use: Not Currently   Drug use: Never     Allergies   Patient has no known allergies.   Review of Systems Review of Systems  Constitutional:  Negative for fever.  HENT:  Positive for congestion and rhinorrhea. Negative for ear pain and sore throat.   Respiratory:  Positive for cough. Negative for shortness of breath and wheezing.      Physical Exam Triage Vital Signs ED Triage Vitals  Encounter Vitals Group     BP 11/27/23 1248 130/71     Systolic BP Percentile --      Diastolic BP Percentile --      Pulse Rate 11/27/23 1248 76     Resp 11/27/23 1248 17     Temp 11/27/23 1248 98.5 F (36.9 C)     Temp Source 11/27/23 1248 Oral     SpO2 11/27/23 1248 96 %     Weight --      Height --      Head Circumference --      Peak Flow --      Pain Score 11/27/23 1246 0     Pain Loc --      Pain Education --      Exclude from Growth Chart --    No data found.  Updated Vital Signs BP 130/71 (BP Location: Right Arm)   Pulse 76   Temp 98.5 F (36.9 C) (Oral)   Resp 17   SpO2 96%   Visual Acuity Right Eye Distance:   Left Eye Distance:   Bilateral Distance:    Right Eye Near:   Left Eye Near:    Bilateral Near:     Physical Exam Vitals and nursing note reviewed.  Constitutional:      Appearance: Normal appearance. He is not ill-appearing.  HENT:     Head: Normocephalic and atraumatic.     Right Ear: Tympanic membrane,  ear canal and external ear normal. There is no impacted cerumen.     Left Ear: Tympanic membrane, ear canal and external ear normal. There is no impacted cerumen.     Nose: Congestion and rhinorrhea present.     Comments: Nasal mucosa is edematous and mildly erythematous with scant clear discharge in both nares.    Mouth/Throat:     Mouth: Mucous membranes are moist.     Pharynx: Oropharynx is clear. No oropharyngeal exudate or posterior oropharyngeal erythema.  Cardiovascular:  Rate and Rhythm: Normal rate and regular rhythm.     Pulses: Normal pulses.     Heart sounds: Normal heart sounds. No murmur heard.    No friction rub. No gallop.  Pulmonary:     Effort: Pulmonary effort is normal.     Breath sounds: Wheezing and rhonchi present.     Comments: Wheezes and rhonchi in the upper middle lung fields bilaterally. Musculoskeletal:     Cervical back: Normal range of motion and neck supple. No tenderness.  Lymphadenopathy:     Cervical: No cervical adenopathy.  Skin:    General: Skin is warm and dry.     Capillary Refill: Capillary refill takes less than 2 seconds.     Findings: No rash.  Neurological:     Mental Status: He is alert.      UC Treatments / Results  Labs (all labs ordered are listed, but only abnormal results are displayed) Labs Reviewed - No data to display  EKG   Radiology No results found.  Procedures Procedures (including critical care time)  Medications Ordered in UC Medications - No data to display  Initial Impression / Assessment and Plan / UC Course  I have reviewed the triage vital signs and the nursing notes.  Pertinent labs & imaging results that were available during my care of the patient were reviewed by me and considered in my medical decision making (see chart for details).   Patient is a nontoxic-appearing 63 year old male presenting for evaluation of 4 days worth of respiratory symptoms as outlined in HPI above.  His mother  significant plaint is a cough that is productive for yellow-green mucus.  He is able to speak in full sentence without dyspnea or tachypnea.  Room air oxygen saturation is 96%.  He is afebrile with an oral temp of 98.5.  His physical exam does reveal inflammation of his upper respiratory tract with clear rhinorrhea.  Cardiopulmonary exam reveals wheezes and rhonchi in upper and middle lung fields bilaterally.  Patient exam is consistent with bronchitis.  I will discharge him home on doxycycline 100 mg twice daily for 7 days for treatment of bronchitis along with a prescription for Atrovent nasal spray to help with nasal congestion, Tessalon Perles and Promethazine DM cough syrup for cough and congestion.  He reports that his mucus does get thick if he does not use Mucinex wife encouraged him to continue doing so.  He should also increase his oral fluid intake to help thin out his mucus.  Return precautions reviewed.   Final Clinical Impressions(s) / UC Diagnoses   Final diagnoses:  Bronchitis     Discharge Instructions      Take the doxycycline twice daily with food for 7 days for treatment of your bronchitis.  Use over-the-counter Tylenol and/or ibuprofen according the package instructions as needed for any fever or pain.  You may continue to use over-the-counter Mucinex to help break up the stickiness of your mucus.  This is best associated with an increase in water intake so it helps keep the mucus thin and more easily manageable.  Use the Atrovent nasal spray, 2 squirts in each nostril every 6 hours, as needed for runny nose and postnasal drip.  Use the Tessalon Perles every 8 hours during the day.  Take them with a small sip of water.  They may give you some numbness to the base of your tongue or a metallic taste in your mouth, this is normal.  Use the Promethazine DM  cough syrup at bedtime for cough and congestion.  It will make you drowsy so do not take it during the day.  Return for  reevaluation or see your primary care provider for any new or worsening symptoms.      ED Prescriptions     Medication Sig Dispense Auth. Provider   ipratropium (ATROVENT) 0.06 % nasal spray Place 2 sprays into both nostrils 4 (four) times daily. 15 mL Becky Augusta, NP   promethazine-dextromethorphan (PROMETHAZINE-DM) 6.25-15 MG/5ML syrup Take 5 mLs by mouth 4 (four) times daily as needed. 118 mL Becky Augusta, NP   benzonatate (TESSALON) 100 MG capsule Take 2 capsules (200 mg total) by mouth every 8 (eight) hours. 21 capsule Becky Augusta, NP   doxycycline (VIBRAMYCIN) 100 MG capsule Take 1 capsule (100 mg total) by mouth 2 (two) times daily for 7 days. 14 capsule Becky Augusta, NP      PDMP not reviewed this encounter.   Becky Augusta, NP 11/27/23 1302

## 2023-11-27 NOTE — Discharge Instructions (Addendum)
Take the doxycycline twice daily with food for 7 days for treatment of your bronchitis.  Use over-the-counter Tylenol and/or ibuprofen according the package instructions as needed for any fever or pain.  You may continue to use over-the-counter Mucinex to help break up the stickiness of your mucus.  This is best associated with an increase in water intake so it helps keep the mucus thin and more easily manageable.  Use the Atrovent nasal spray, 2 squirts in each nostril every 6 hours, as needed for runny nose and postnasal drip.  Use the Tessalon Perles every 8 hours during the day.  Take them with a small sip of water.  They may give you some numbness to the base of your tongue or a metallic taste in your mouth, this is normal.  Use the Promethazine DM cough syrup at bedtime for cough and congestion.  It will make you drowsy so do not take it during the day.  Return for reevaluation or see your primary care provider for any new or worsening symptoms.

## 2023-11-27 NOTE — ED Triage Notes (Signed)
Sx x 4 days  Sneezing Nasal congestion Runny nose Productive cough yellowish-green

## 2024-02-19 ENCOUNTER — Other Ambulatory Visit: Payer: Self-pay | Admitting: Acute Care

## 2024-02-19 DIAGNOSIS — Z87891 Personal history of nicotine dependence: Secondary | ICD-10-CM

## 2024-02-19 DIAGNOSIS — Z122 Encounter for screening for malignant neoplasm of respiratory organs: Secondary | ICD-10-CM

## 2024-03-08 ENCOUNTER — Ambulatory Visit
Admission: RE | Admit: 2024-03-08 | Discharge: 2024-03-08 | Disposition: A | Discharge status: Home / Self Care | Source: Ambulatory Visit | Attending: Acute Care | Admitting: Acute Care

## 2024-03-08 DIAGNOSIS — Z87891 Personal history of nicotine dependence: Secondary | ICD-10-CM | POA: Diagnosis present

## 2024-03-08 DIAGNOSIS — Z122 Encounter for screening for malignant neoplasm of respiratory organs: Secondary | ICD-10-CM | POA: Insufficient documentation

## 2024-03-31 ENCOUNTER — Other Ambulatory Visit: Payer: Self-pay

## 2024-03-31 DIAGNOSIS — F17210 Nicotine dependence, cigarettes, uncomplicated: Secondary | ICD-10-CM

## 2024-03-31 DIAGNOSIS — Z87891 Personal history of nicotine dependence: Secondary | ICD-10-CM

## 2024-03-31 DIAGNOSIS — Z122 Encounter for screening for malignant neoplasm of respiratory organs: Secondary | ICD-10-CM

## 2024-05-21 ENCOUNTER — Ambulatory Visit (INDEPENDENT_AMBULATORY_CARE_PROVIDER_SITE_OTHER): Payer: Self-pay

## 2024-05-21 DIAGNOSIS — K5120 Ulcerative (chronic) proctitis without complications: Secondary | ICD-10-CM | POA: Diagnosis present

## 2024-05-21 DIAGNOSIS — K3189 Other diseases of stomach and duodenum: Secondary | ICD-10-CM | POA: Diagnosis not present

## 2024-05-21 DIAGNOSIS — K2289 Other specified disease of esophagus: Secondary | ICD-10-CM | POA: Diagnosis not present

## 2024-05-21 DIAGNOSIS — K5710 Diverticulosis of small intestine without perforation or abscess without bleeding: Secondary | ICD-10-CM | POA: Diagnosis not present

## 2024-05-21 DIAGNOSIS — K640 First degree hemorrhoids: Secondary | ICD-10-CM | POA: Diagnosis not present

## 2024-05-21 DIAGNOSIS — K5289 Other specified noninfective gastroenteritis and colitis: Secondary | ICD-10-CM | POA: Diagnosis not present

## 2024-05-21 DIAGNOSIS — Z538 Procedure and treatment not carried out for other reasons: Secondary | ICD-10-CM | POA: Diagnosis not present

## 2024-06-08 ENCOUNTER — Other Ambulatory Visit: Payer: Self-pay | Admitting: Gastroenterology

## 2024-06-08 DIAGNOSIS — R933 Abnormal findings on diagnostic imaging of other parts of digestive tract: Secondary | ICD-10-CM

## 2024-06-08 DIAGNOSIS — K51211 Ulcerative (chronic) proctitis with rectal bleeding: Secondary | ICD-10-CM

## 2024-06-18 ENCOUNTER — Ambulatory Visit
Admission: RE | Admit: 2024-06-18 | Discharge: 2024-06-18 | Disposition: A | Discharge status: Home / Self Care | Source: Ambulatory Visit | Attending: Gastroenterology | Admitting: Gastroenterology

## 2024-06-18 DIAGNOSIS — K51211 Ulcerative (chronic) proctitis with rectal bleeding: Secondary | ICD-10-CM | POA: Diagnosis present

## 2024-06-18 DIAGNOSIS — R933 Abnormal findings on diagnostic imaging of other parts of digestive tract: Secondary | ICD-10-CM | POA: Diagnosis present

## 2024-06-18 MED ORDER — IOHEXOL 300 MG/ML  SOLN
100.0000 mL | Freq: Once | INTRAMUSCULAR | Status: AC | PRN
Start: 2024-06-18 — End: 2024-06-18
  Administered 2024-06-18: 100 mL via INTRAVENOUS

## 2024-07-12 ENCOUNTER — Ambulatory Visit: Payer: Self-pay | Admitting: Surgery

## 2024-07-12 NOTE — H&P (Signed)
 Subjective:   CC: Non-recurrent unilateral inguinal hernia without obstruction or gangrene [K40.90]  HPI: referred by Alda Carpen, MD for evaluation of above.   History of Present Illness Jason Dickson is a 63 year old male with a right inguinal hernia who presents with pain.  He has experienced a right inguinal hernia for the past month, with constant burning and stinging pain that does not radiate. The pain intensifies with coughing. He can reduce the hernia manually and reports no functional impairment.  He has constipation and ulcerative colitis, which are well controlled with medication. No other abdominal conditions or surgeries are noted. No genitourinary symptoms are present.  He does not use tobacco or alcohol. There is no unintentional weight loss or fever.   Past Medical History:  has a past medical history of Allergy (02-10-1961), Arthritis, Emphysema lung (CT 01/2022) (02/15/2022), Emphysema lung (CT 01/2022) (02/15/2022), Essential hypertension, GERD (gastroesophageal reflux disease), Pure hypercholesterolemia, and Ulcerative colitis (CMS/HHS-HCC).  Past Surgical History:  Past Surgical History:  Procedure Laterality Date   ORIF METACARPAL FRACTURE Right 03/02/2013   Fifth metacarpal   Right shoulder repair  07/2013   ARTHROSCOPIC ROTATOR CUFF REPAIR Left 10/18/2022   Colon@[facility]   05/21/2024   Colitis/Rpt52mon/TKT   EGD@[facility]   05/21/2024   NML/NoRpt/TKT   FRACTURE SURGERY  ?   broken hand    Family History: family history includes Cancer in his mother; No Known Problems in his brother.  Social History:  reports that he quit smoking about 12 years ago. His smoking use included cigarettes. He started smoking about 37 years ago. He has a 31.3 pack-year smoking history. He has been exposed to tobacco smoke. He has never used smokeless tobacco. He reports that he does not currently use alcohol. He reports that he does not use drugs.  Current Medications: has a  current medication list which includes the following prescription(s): ascorbic acid, cholecalciferol, cyanocobalamin, fluticasone  propionate, herbal supplement, ibuprofen , lisinopril, loratadine, magnesium gluconate, mesalamine, niacin, omeprazole, potassium, prednisone, sildenafil, and turmeric.  Allergies:  Allergies as of 07/12/2024   (No Known Allergies)    ROS:  A 15 point review of systems was performed and pertinent positives and negatives noted in HPI   Objective:     BP 112/62   Pulse 80   Ht 180.3 cm (5' 11)   Wt 95.7 kg (211 lb)   BMI 29.43 kg/m   Constitutional :  Alert, cooperative, no distress  Lymphatics/Throat:  Supple, no lymphadenopathy  Respiratory:  clear to auscultation bilaterally  Cardiovascular:  regular rate and rhythm  Gastrointestinal: soft, non-tender; bowel sounds normal; no masses,  no organomegaly. inguinal hernia noted.  small, reducible, no overlying skin changes, and right, possible left.  Small umbilical hernia, initial, reducible  Musculoskeletal: Steady gait and movement  Skin: Cool and moist  Psychiatric: Normal affect, non-agitated, not confused       LABS:  N/a   RADS: N/a Assessment:       Non-recurrent unilateral inguinal hernia without obstruction or gangrene [K40.90] Umbilical hernia  Plan:     1. Non-recurrent unilateral inguinal hernia without obstruction or gangrene [K40.90]    Discussed the risk of surgery including recurrence, which can be up to 50% in the case of incisional or complex hernias, possible use of prosthetic materials (mesh) and the increased risk of mesh infxn if used, bleeding, chronic pain, post-op infxn, post-op SBO or ileus, and possible re-operation to address said risks. The risks of general anesthetic, if used, includes MI, CVA,  sudden death or even reaction to anesthetic medications also discussed. Alternatives include continued observation.  Benefits include possible symptom relief, prevention of  incarceration, strangulation, enlargement in size over time, and the risk of emergency surgery in the face of strangulation.   Typical post-op recovery time of 3-5 days with 2 weeks of activity restrictions were also discussed.  ED return precautions given for sudden increase in pain, size of hernia with accompanying fever, nausea, and/or vomiting.  The patient verbalized understanding and all questions were answered to the patient's satisfaction.   2. Patient has elected to proceed with surgical treatment. Procedure will be scheduled.   RIGHT, possible left, Non-recurrent unilateral inguinal hernia without obstruction or gangrene [K40.90], and umbilical hernia, initial, reducible.  Will proceed with primary umbilical hernia repair [id number] right, possible left robotic assisted laparoscopic repair with mesh. [id number]  labs/images/medications/previous chart entries reviewed personally and relevant changes/updates noted above.

## 2024-07-12 NOTE — H&P (View-Only) (Signed)
 Subjective:   CC: Non-recurrent unilateral inguinal hernia without obstruction or gangrene [K40.90]  HPI: referred by Alda Carpen, MD for evaluation of above.   History of Present Illness Jason Dickson is a 63 year old male with a right inguinal hernia who presents with pain.  He has experienced a right inguinal hernia for the past month, with constant burning and stinging pain that does not radiate. The pain intensifies with coughing. He can reduce the hernia manually and reports no functional impairment.  He has constipation and ulcerative colitis, which are well controlled with medication. No other abdominal conditions or surgeries are noted. No genitourinary symptoms are present.  He does not use tobacco or alcohol. There is no unintentional weight loss or fever.   Past Medical History:  has a past medical history of Allergy (02-10-1961), Arthritis, Emphysema lung (CT 01/2022) (02/15/2022), Emphysema lung (CT 01/2022) (02/15/2022), Essential hypertension, GERD (gastroesophageal reflux disease), Pure hypercholesterolemia, and Ulcerative colitis (CMS/HHS-HCC).  Past Surgical History:  Past Surgical History:  Procedure Laterality Date   ORIF METACARPAL FRACTURE Right 03/02/2013   Fifth metacarpal   Right shoulder repair  07/2013   ARTHROSCOPIC ROTATOR CUFF REPAIR Left 10/18/2022   Colon@[facility]   05/21/2024   Colitis/Rpt52mon/TKT   EGD@[facility]   05/21/2024   NML/NoRpt/TKT   FRACTURE SURGERY  ?   broken hand    Family History: family history includes Cancer in his mother; No Known Problems in his brother.  Social History:  reports that he quit smoking about 12 years ago. His smoking use included cigarettes. He started smoking about 37 years ago. He has a 31.3 pack-year smoking history. He has been exposed to tobacco smoke. He has never used smokeless tobacco. He reports that he does not currently use alcohol. He reports that he does not use drugs.  Current Medications: has a  current medication list which includes the following prescription(s): ascorbic acid, cholecalciferol, cyanocobalamin, fluticasone  propionate, herbal supplement, ibuprofen , lisinopril, loratadine, magnesium gluconate, mesalamine, niacin, omeprazole, potassium, prednisone, sildenafil, and turmeric.  Allergies:  Allergies as of 07/12/2024   (No Known Allergies)    ROS:  A 15 point review of systems was performed and pertinent positives and negatives noted in HPI   Objective:     BP 112/62   Pulse 80   Ht 180.3 cm (5' 11)   Wt 95.7 kg (211 lb)   BMI 29.43 kg/m   Constitutional :  Alert, cooperative, no distress  Lymphatics/Throat:  Supple, no lymphadenopathy  Respiratory:  clear to auscultation bilaterally  Cardiovascular:  regular rate and rhythm  Gastrointestinal: soft, non-tender; bowel sounds normal; no masses,  no organomegaly. inguinal hernia noted.  small, reducible, no overlying skin changes, and right, possible left.  Small umbilical hernia, initial, reducible  Musculoskeletal: Steady gait and movement  Skin: Cool and moist  Psychiatric: Normal affect, non-agitated, not confused       LABS:  N/a   RADS: N/a Assessment:       Non-recurrent unilateral inguinal hernia without obstruction or gangrene [K40.90] Umbilical hernia  Plan:     1. Non-recurrent unilateral inguinal hernia without obstruction or gangrene [K40.90]    Discussed the risk of surgery including recurrence, which can be up to 50% in the case of incisional or complex hernias, possible use of prosthetic materials (mesh) and the increased risk of mesh infxn if used, bleeding, chronic pain, post-op infxn, post-op SBO or ileus, and possible re-operation to address said risks. The risks of general anesthetic, if used, includes MI, CVA,  sudden death or even reaction to anesthetic medications also discussed. Alternatives include continued observation.  Benefits include possible symptom relief, prevention of  incarceration, strangulation, enlargement in size over time, and the risk of emergency surgery in the face of strangulation.   Typical post-op recovery time of 3-5 days with 2 weeks of activity restrictions were also discussed.  ED return precautions given for sudden increase in pain, size of hernia with accompanying fever, nausea, and/or vomiting.  The patient verbalized understanding and all questions were answered to the patient's satisfaction.   2. Patient has elected to proceed with surgical treatment. Procedure will be scheduled.   RIGHT, possible left, Non-recurrent unilateral inguinal hernia without obstruction or gangrene [K40.90], and umbilical hernia, initial, reducible.  Will proceed with primary umbilical hernia repair [id number] right, possible left robotic assisted laparoscopic repair with mesh. [id number]  labs/images/medications/previous chart entries reviewed personally and relevant changes/updates noted above.

## 2024-07-30 ENCOUNTER — Encounter
Admission: RE | Admit: 2024-07-30 | Discharge: 2024-07-30 | Disposition: A | Discharge status: Home / Self Care | Source: Ambulatory Visit | Attending: Surgery | Admitting: Surgery

## 2024-07-30 ENCOUNTER — Other Ambulatory Visit: Payer: Self-pay

## 2024-07-30 DIAGNOSIS — K4090 Unilateral inguinal hernia, without obstruction or gangrene, not specified as recurrent: Secondary | ICD-10-CM

## 2024-07-30 DIAGNOSIS — Z01812 Encounter for preprocedural laboratory examination: Secondary | ICD-10-CM

## 2024-07-30 DIAGNOSIS — I10 Essential (primary) hypertension: Secondary | ICD-10-CM

## 2024-07-30 DIAGNOSIS — Z01810 Encounter for preprocedural cardiovascular examination: Secondary | ICD-10-CM

## 2024-07-30 HISTORY — DX: Unilateral primary osteoarthritis, left hip: M16.12

## 2024-07-30 HISTORY — DX: Pneumonia, unspecified organism: J18.9

## 2024-07-30 HISTORY — DX: Calculus of gallbladder without cholecystitis without obstruction: K80.20

## 2024-07-30 HISTORY — DX: Atherosclerosis of aorta: I70.0

## 2024-07-30 HISTORY — DX: Emphysema, unspecified: J43.9

## 2024-07-30 HISTORY — DX: Unilateral inguinal hernia, without obstruction or gangrene, not specified as recurrent: K40.90

## 2024-07-30 NOTE — Patient Instructions (Addendum)
 Your procedure is scheduled on: 08/06/24 - Friday Report to the Registration Desk on the 1st floor of the [facility]. To find out your arrival time, please call [phone] between 1PM - 3PM on: 08/05/24 - Thursday If your arrival time is 6:00 am, do not arrive before that time as the [facility] entrance doors do not open until 6:00 am.  REMEMBER: Instructions that are not followed completely may result in serious medical risk, up to and including death; or upon the discretion of your surgeon and anesthesiologist your surgery may need to be rescheduled.  Do not eat food after midnight the night before surgery.  No gum chewing or hard candies.  You may however, drink CLEAR liquids up to 2 hours before you are scheduled to arrive for your surgery. Do not drink anything within 2 hours of your scheduled arrival time.  Clear liquids include: - water  - apple juice without pulp - gatorade (not RED colors) - black coffee or tea (Do NOT add milk or creamers to the coffee or tea) Do NOT drink anything that is not on this list.  One week prior to surgery: Stop Anti-inflammatories (NSAIDS) such as Advil , Aleve , Ibuprofen , Motrin , Naproxen , Naprosyn  and Aspirin  based products such as Excedrin, Goody's Powder, BC Powder. You may take Tylenol  if needed for pain up until the day of surgery.  Stop taking beginning 10/03, ANY OVER THE COUNTER supplements until after surgery.  tadalafil (ADCIRCA/CIALIS) hold beginning 10/08.  ON THE DAY OF SURGERY ONLY TAKE THESE MEDICATIONS WITH SIPS OF WATER:  omeprazole (PRILOSEC)    No Alcohol for 24 hours before or after surgery.  No Smoking including e-cigarettes for 24 hours before surgery.  No chewable tobacco products for at least 6 hours before surgery.  No nicotine patches on the day of surgery.  Do not use any recreational drugs for at least a week (preferably 2 weeks) before your surgery.  Please be advised that the combination of cocaine  and anesthesia may have negative outcomes, up to and including death. If you test positive for cocaine, your surgery will be cancelled.  On the morning of surgery brush your teeth with toothpaste and water, you may rinse your mouth with mouthwash if you wish. Do not swallow any toothpaste or mouthwash.  Use CHG Soap or wipes as directed on instruction sheet.  Do not wear jewelry, make-up, hairpins, clips or nail polish.  For welded (permanent) jewelry: bracelets, anklets, waist bands, etc.  Please have this removed prior to surgery.  If it is not removed, there is a chance that hospital personnel will need to cut it off on the day of surgery.  Do not wear lotions, powders, or perfumes.   Do not shave body hair from the neck down 48 hours before surgery.  Contact lenses, hearing aids and dentures may not be worn into surgery.  Do not bring valuables to the hospital. [facility] is not responsible for any missing/lost belongings or valuables.   Notify your doctor if there is any change in your medical condition (cold, fever, infection).  Wear comfortable clothing (specific to your surgery type) to the hospital.  After surgery, you can help prevent lung complications by doing breathing exercises.  Take deep breaths and cough every 1-2 hours. Your doctor may order a device called an Incentive Spirometer to help you take deep breaths.  When coughing or sneezing, hold a pillow firmly against your incision with both hands. This is called "splinting." Doing this  helps protect your incision. It also decreases belly discomfort.  If you are being admitted to the hospital overnight, leave your suitcase in the car. After surgery it may be brought to your room.  In case of increased patient census, it may be necessary for you, the patient, to continue your postoperative care in the Same Day Surgery department.  If you are being discharged the day of surgery, you will not be allowed to drive  home. You will need a responsible individual to drive you home and stay with you for 24 hours after surgery.   If you are taking public transportation, you will need to have a responsible individual with you.  Please call the Pre-admissions Testing Dept. at [phone] if you have any questions about these instructions.  Surgery Visitation Policy:  Patients having surgery or a procedure may have two visitors.  Children under the age of 58 must have an adult with them who is not the patient.  Inpatient Visitation:    Visiting hours are 7 a.m. to 8 p.m. Up to four visitors are allowed at one time in a patient room. The visitors may rotate out with other people during the day.  One visitor age 65 or older may stay with the patient overnight and must be in the room by 8 p.m.   Merchandiser, retail to address health-related social needs:  [email or web address]                                                                                                             Preparing for Surgery with CHLORHEXIDINE  GLUCONATE (CHG) Soap  Chlorhexidine  Gluconate (CHG) Soap  o An antiseptic cleaner that kills germs and bonds with the skin to continue killing germs even after washing  o Used for showering the night before surgery and morning of surgery  Before surgery, you can play an important role by reducing the number of germs on your skin.  CHG (Chlorhexidine  gluconate) soap is an antiseptic cleanser which kills germs and bonds with the skin to continue killing germs even after washing.  Please do not use if you have an allergy to CHG or antibacterial soaps. If your skin becomes reddened/irritated stop using the CHG.  1. Shower the NIGHT BEFORE SURGERY with CHG soap.  2. If you choose to wash your hair, wash your hair first as usual with your normal shampoo.  3. After shampooing, rinse your hair and body thoroughly to remove the shampoo.  4. Use CHG as you would  any other liquid soap. You can apply CHG directly to the skin and wash gently with a clean washcloth.  5. Apply the CHG soap to your body only from the neck down. Do not use on open wounds or open sores. Avoid contact with your eyes, ears, mouth, and genitals (private parts). Wash face and genitals (private parts) with your normal soap.  6. Wash thoroughly, paying special attention to the area where your surgery will be performed.  7. Thoroughly rinse your body with warm water.  8.  Do not shower/wash with your normal soap after using and rinsing off the CHG soap.  9. Do not use lotions, oils, etc., after showering with CHG.  10. Pat yourself dry with a clean towel.  11. Wear clean pajamas to bed the night before surgery.  12. Place clean sheets on your bed the night of your shower and do not sleep with pets.  13. Do not apply any deodorants/lotions/powders.  14. Please wear clean clothes to the hospital.  15. Remember to brush your teeth with your regular toothpaste.

## 2024-08-02 ENCOUNTER — Encounter
Admission: RE | Admit: 2024-08-02 | Discharge: 2024-08-02 | Disposition: A | Discharge status: Home / Self Care | Source: Ambulatory Visit | Attending: Surgery | Admitting: Surgery

## 2024-08-02 DIAGNOSIS — Z01810 Encounter for preprocedural cardiovascular examination: Secondary | ICD-10-CM | POA: Diagnosis present

## 2024-08-02 DIAGNOSIS — I10 Essential (primary) hypertension: Secondary | ICD-10-CM | POA: Insufficient documentation

## 2024-08-02 DIAGNOSIS — Z01812 Encounter for preprocedural laboratory examination: Secondary | ICD-10-CM

## 2024-08-02 DIAGNOSIS — Z01818 Encounter for other preprocedural examination: Secondary | ICD-10-CM | POA: Insufficient documentation

## 2024-08-02 LAB — BASIC METABOLIC PANEL WITH GFR
Anion gap: 9 (ref 5–15)
BUN: 23 mg/dL (ref 8–23)
CO2: 26 mmol/L (ref 22–32)
Calcium: 9 mg/dL (ref 8.9–10.3)
Chloride: 103 mmol/L (ref 98–111)
Creatinine, Ser: 1.21 mg/dL (ref 0.61–1.24)
GFR, Estimated: >60 mL/min (ref >60)
Glucose, Bld: 86 mg/dL (ref 70–99)
Potassium: 4.3 mmol/L (ref 3.5–5.1)
Sodium: 138 mmol/L (ref 135–145)

## 2024-08-02 LAB — CBC
HCT: 37.8 % — ABNORMAL LOW (ref 39.0–52.0)
Hemoglobin: 12.7 g/dL — ABNORMAL LOW (ref 13.0–17.0)
MCH: 31.9 pg (ref 26.0–34.0)
MCHC: 33.6 g/dL (ref 30.0–36.0)
MCV: 95 fL (ref 80.0–100.0)
Platelets: 267 K/uL (ref 150–400)
RBC: 3.98 MIL/uL — ABNORMAL LOW (ref 4.22–5.81)
RDW: 12 % (ref 11.5–15.5)
WBC: 5.7 K/uL (ref 4.0–10.5)
nRBC: 0 % (ref 0.0–0.2)

## 2024-08-06 ENCOUNTER — Ambulatory Visit: Admitting: Anesthesiology

## 2024-08-06 ENCOUNTER — Ambulatory Visit
Admission: RE | Admit: 2024-08-06 | Discharge: 2024-08-06 | Disposition: A | Discharge status: Home / Self Care | Attending: Surgery | Admitting: Surgery

## 2024-08-06 ENCOUNTER — Other Ambulatory Visit: Payer: Self-pay

## 2024-08-06 ENCOUNTER — Encounter
Admission: RE | Disposition: A | Discharge status: Home / Self Care | Payer: Self-pay | Source: Home / Self Care | Attending: Surgery

## 2024-08-06 ENCOUNTER — Encounter: Payer: Self-pay | Admitting: Surgery

## 2024-08-06 DIAGNOSIS — Z87891 Personal history of nicotine dependence: Secondary | ICD-10-CM | POA: Diagnosis not present

## 2024-08-06 DIAGNOSIS — J439 Emphysema, unspecified: Secondary | ICD-10-CM | POA: Insufficient documentation

## 2024-08-06 DIAGNOSIS — K5900 Constipation, unspecified: Secondary | ICD-10-CM | POA: Insufficient documentation

## 2024-08-06 DIAGNOSIS — Z7722 Contact with and (suspected) exposure to environmental tobacco smoke (acute) (chronic): Secondary | ICD-10-CM | POA: Insufficient documentation

## 2024-08-06 DIAGNOSIS — K5190 Ulcerative colitis, unspecified, without complications: Secondary | ICD-10-CM | POA: Insufficient documentation

## 2024-08-06 DIAGNOSIS — I10 Essential (primary) hypertension: Secondary | ICD-10-CM | POA: Insufficient documentation

## 2024-08-06 DIAGNOSIS — K4090 Unilateral inguinal hernia, without obstruction or gangrene, not specified as recurrent: Secondary | ICD-10-CM | POA: Diagnosis present

## 2024-08-06 DIAGNOSIS — K219 Gastro-esophageal reflux disease without esophagitis: Secondary | ICD-10-CM | POA: Diagnosis not present

## 2024-08-06 HISTORY — PX: XI ROBOTIC ASSISTED INGUINAL HERNIA REPAIR WITH MESH: SHX6706

## 2024-08-06 HISTORY — PX: UMBILICAL HERNIA REPAIR: SHX196

## 2024-08-06 SURGERY — REPAIR, HERNIA, UMBILICAL, ADULT
Anesthesia: General | Site: Abdomen | Laterality: Right

## 2024-08-06 MED ORDER — FENTANYL CITRATE (PF) 100 MCG/2ML IJ SOLN
INTRAMUSCULAR | Status: AC
  Filled 2024-08-06: qty 2

## 2024-08-06 MED ORDER — ORAL CARE MOUTH RINSE: 15.0000 mL | Freq: Once | OROMUCOSAL | Status: AC

## 2024-08-06 MED ORDER — GABAPENTIN 300 MG PO CAPS
300.0000 mg | ORAL_CAPSULE | ORAL | Status: AC
  Administered 2024-08-06: 300 mg via ORAL

## 2024-08-06 MED ORDER — CEFAZOLIN SODIUM-DEXTROSE 2-4 GM/100ML-% IV SOLN
INTRAVENOUS | Status: AC
  Filled 2024-08-06: qty 100

## 2024-08-06 MED ORDER — CHLORHEXIDINE GLUCONATE 0.12 % MT SOLN
15.0000 mL | Freq: Once | OROMUCOSAL | Status: AC
  Administered 2024-08-06: 15 mL via OROMUCOSAL

## 2024-08-06 MED ORDER — HYDRALAZINE HCL 20 MG/ML IJ SOLN
INTRAMUSCULAR | Status: DC | PRN
Start: 2024-08-06 — End: 2024-08-06
  Administered 2024-08-06: 10 mg via INTRAVENOUS

## 2024-08-06 MED ORDER — OXYCODONE HCL 5 MG/5ML PO SOLN: 5.0000 mg | Freq: Once | ORAL | Status: AC | PRN

## 2024-08-06 MED ORDER — LIDOCAINE HCL (CARDIAC) PF 100 MG/5ML IV SOSY
PREFILLED_SYRINGE | INTRAVENOUS | Status: DC | PRN
  Administered 2024-08-06: 100 mg via INTRAVENOUS

## 2024-08-06 MED ORDER — BUPIVACAINE-EPINEPHRINE (PF) 0.5% -1:200000 IJ SOLN
INTRAMUSCULAR | Status: DC | PRN
  Administered 2024-08-06: 30 mL via PERINEURAL

## 2024-08-06 MED ORDER — ROCURONIUM BROMIDE 100 MG/10ML IV SOLN
INTRAVENOUS | Status: DC | PRN
  Administered 2024-08-06: 20 mg via INTRAVENOUS
  Administered 2024-08-06: 50 mg via INTRAVENOUS
  Administered 2024-08-06: 10 mg via INTRAVENOUS

## 2024-08-06 MED ORDER — FENTANYL CITRATE (PF) 100 MCG/2ML IJ SOLN
50.0000 ug | Freq: Once | INTRAMUSCULAR | Status: AC
  Administered 2024-08-06: 50 ug via INTRAVENOUS

## 2024-08-06 MED ORDER — LACTATED RINGERS IV SOLN: INTRAVENOUS | Status: DC

## 2024-08-06 MED ORDER — BUPIVACAINE-EPINEPHRINE (PF) 0.5% -1:200000 IJ SOLN
INTRAMUSCULAR | Status: AC
  Filled 2024-08-06: qty 30

## 2024-08-06 MED ORDER — TRAMADOL HCL 50 MG PO TABS
50.0000 mg | ORAL_TABLET | Freq: Three times a day (TID) | ORAL | 0 refills | Status: DC | PRN
  Filled 2024-08-06: qty 6, 2d supply, fill #0

## 2024-08-06 MED ORDER — ACETAMINOPHEN 10 MG/ML IV SOLN: 1000.0000 mg | Freq: Once | INTRAVENOUS | Status: DC | PRN

## 2024-08-06 MED ORDER — CELECOXIB 200 MG PO CAPS
ORAL_CAPSULE | ORAL | Status: AC
  Filled 2024-08-06: qty 1

## 2024-08-06 MED ORDER — ACETAMINOPHEN 325 MG PO TABS
650.0000 mg | ORAL_TABLET | Freq: Three times a day (TID) | ORAL | 0 refills | Status: AC | PRN
  Filled 2024-08-06: qty 40, 7d supply, fill #0

## 2024-08-06 MED ORDER — PROPOFOL 10 MG/ML IV BOLUS
INTRAVENOUS | Status: DC | PRN
  Administered 2024-08-06: 200 mg via INTRAVENOUS

## 2024-08-06 MED ORDER — ONDANSETRON HCL 4 MG/2ML IJ SOLN
INTRAMUSCULAR | Status: DC | PRN
  Administered 2024-08-06: 4 mg via INTRAVENOUS

## 2024-08-06 MED ORDER — SUGAMMADEX SODIUM 200 MG/2ML IV SOLN
INTRAVENOUS | Status: DC | PRN
  Administered 2024-08-06: 200 mg via INTRAVENOUS

## 2024-08-06 MED ORDER — CHLORHEXIDINE GLUCONATE CLOTH 2 % EX PADS
6.0000 | MEDICATED_PAD | Freq: Once | CUTANEOUS | Status: AC
  Administered 2024-08-06: 6 via TOPICAL

## 2024-08-06 MED ORDER — CHLORHEXIDINE GLUCONATE 0.12 % MT SOLN
OROMUCOSAL | Status: AC
  Filled 2024-08-06: qty 15

## 2024-08-06 MED ORDER — DOCUSATE SODIUM 100 MG PO CAPS
100.0000 mg | ORAL_CAPSULE | Freq: Two times a day (BID) | ORAL | 0 refills | Status: AC | PRN
Start: 2024-08-06 — End: 2024-08-16
  Filled 2024-08-06: qty 20, 10d supply, fill #0

## 2024-08-06 MED ORDER — ACETAMINOPHEN 500 MG PO TABS
1000.0000 mg | ORAL_TABLET | ORAL | Status: AC
  Administered 2024-08-06: 1000 mg via ORAL

## 2024-08-06 MED ORDER — OXYCODONE HCL 5 MG PO TABS
ORAL_TABLET | ORAL | Status: AC
  Filled 2024-08-06: qty 1

## 2024-08-06 MED ORDER — ONDANSETRON HCL 4 MG/2ML IJ SOLN: 4.0000 mg | Freq: Once | INTRAMUSCULAR | Status: DC | PRN

## 2024-08-06 MED ORDER — CELECOXIB 200 MG PO CAPS
200.0000 mg | ORAL_CAPSULE | ORAL | Status: AC
  Administered 2024-08-06: 200 mg via ORAL

## 2024-08-06 MED ORDER — 0.9 % SODIUM CHLORIDE (POUR BTL) OPTIME
TOPICAL | Status: DC | PRN
Start: 2024-08-06 — End: 2024-08-06
  Administered 2024-08-06: 500 mL

## 2024-08-06 MED ORDER — DEXMEDETOMIDINE HCL IN NACL 80 MCG/20ML IV SOLN
INTRAVENOUS | Status: DC | PRN
  Administered 2024-08-06 (×2): 8 ug via INTRAVENOUS

## 2024-08-06 MED ORDER — MIDAZOLAM HCL 2 MG/2ML IJ SOLN
INTRAMUSCULAR | Status: AC
  Filled 2024-08-06: qty 2

## 2024-08-06 MED ORDER — ACETAMINOPHEN 500 MG PO TABS
ORAL_TABLET | ORAL | Status: AC
  Filled 2024-08-06: qty 2

## 2024-08-06 MED ORDER — OXYCODONE HCL 5 MG PO TABS
5.0000 mg | ORAL_TABLET | Freq: Once | ORAL | Status: AC | PRN
  Administered 2024-08-06: 5 mg via ORAL

## 2024-08-06 MED ORDER — CEFAZOLIN SODIUM-DEXTROSE 2-4 GM/100ML-% IV SOLN
2.0000 g | INTRAVENOUS | Status: AC
Start: 2024-08-06 — End: 2024-08-06
  Administered 2024-08-06: 2 g via INTRAVENOUS

## 2024-08-06 MED ORDER — GABAPENTIN 300 MG PO CAPS
ORAL_CAPSULE | ORAL | Status: AC
  Filled 2024-08-06: qty 1

## 2024-08-06 MED ORDER — FENTANYL CITRATE (PF) 100 MCG/2ML IJ SOLN
25.0000 ug | INTRAMUSCULAR | Status: DC | PRN
  Administered 2024-08-06: 50 ug via INTRAVENOUS
  Administered 2024-08-06 (×2): 25 ug via INTRAVENOUS
  Administered 2024-08-06: 50 ug via INTRAVENOUS

## 2024-08-06 MED ORDER — MIDAZOLAM HCL 2 MG/2ML IJ SOLN
INTRAMUSCULAR | Status: DC | PRN
  Administered 2024-08-06: 2 mg via INTRAVENOUS

## 2024-08-06 MED ORDER — IBUPROFEN 800 MG PO TABS
800.0000 mg | ORAL_TABLET | Freq: Three times a day (TID) | ORAL | 0 refills | Status: DC | PRN
  Filled 2024-08-06: qty 30, 10d supply, fill #0

## 2024-08-06 MED ORDER — FENTANYL CITRATE (PF) 100 MCG/2ML IJ SOLN
INTRAMUSCULAR | Status: DC | PRN
  Administered 2024-08-06: 100 ug via INTRAVENOUS

## 2024-08-06 SURGICAL SUPPLY — 51 items
BAG PRESSURE INF REUSE 1000 (BAG) IMPLANT
BLADE SURG 15 STRL LF DISP TIS (BLADE) ×2 IMPLANT
BNDG GAUZE DERMACEA FLUFF 4 (GAUZE/BANDAGES/DRESSINGS) ×2 IMPLANT
CHLORAPREP W/TINT 26 (MISCELLANEOUS) IMPLANT
COVER TIP SHEARS 8 DVNC (MISCELLANEOUS) ×2 IMPLANT
COVER WAND RF STERILE (DRAPES) ×2 IMPLANT
DEFOGGER SCOPE WARM SEASHARP (MISCELLANEOUS) ×2 IMPLANT
DERMABOND ADVANCED .7 DNX12 (GAUZE/BANDAGES/DRESSINGS) ×2 IMPLANT
DRAPE ARM DVNC X/XI (DISPOSABLE) ×6 IMPLANT
DRAPE COLUMN DVNC XI (DISPOSABLE) ×2 IMPLANT
DRAPE LAPAROTOMY 77X122 PED (DRAPES) ×2 IMPLANT
ELECTRODE REM PT RTRN 9FT ADLT (ELECTROSURGICAL) ×2 IMPLANT
FORCEPS BPLR FENES DVNC XI (FORCEP) ×2 IMPLANT
GAUZE 4X4 16PLY ~~LOC~~+RFID DBL (SPONGE) IMPLANT
GLOVE BIOGEL PI IND STRL 7.0 (GLOVE) ×4 IMPLANT
GLOVE SURG SYN 6.5 PF PI (GLOVE) ×8 IMPLANT
GOWN STRL REUS W/ TWL LRG LVL3 (GOWN DISPOSABLE) ×8 IMPLANT
HANDLE YANKAUER SUCT BULB TIP (MISCELLANEOUS) IMPLANT
IRRIGATOR SUCT 8 DISP DVNC XI (IRRIGATION / IRRIGATOR) IMPLANT
IV 0.9% NACL 1000 ML (IV SOLUTION) IMPLANT
KIT TURNOVER KIT A (KITS) ×2 IMPLANT
LABEL OR SOLS (LABEL) ×2 IMPLANT
MANIFOLD NEPTUNE II (INSTRUMENTS) ×2 IMPLANT
MESH 3DMAX MID 4X6 RT LRG (Mesh General) IMPLANT
NDL DRIVE SUT CUT DVNC (INSTRUMENTS) ×2 IMPLANT
NDL HYPO 22X1.5 SAFETY MO (MISCELLANEOUS) ×2 IMPLANT
NDL INSUFFLATION 14GA 120MM (NEEDLE) ×2 IMPLANT
NEEDLE DRIVE SUT CUT DVNC (INSTRUMENTS) ×2 IMPLANT
NEEDLE HYPO 22X1.5 SAFETY MO (MISCELLANEOUS) ×2 IMPLANT
NEEDLE INSUFFLATION 14GA 120MM (NEEDLE) ×2 IMPLANT
NS IRRIG 500ML POUR BTL (IV SOLUTION) ×2 IMPLANT
OBTURATOR OPTICALSTD 8 DVNC (TROCAR) ×2 IMPLANT
PACK BASIN MINOR ARMC (MISCELLANEOUS) ×2 IMPLANT
PACK LAP CHOLECYSTECTOMY (MISCELLANEOUS) ×2 IMPLANT
SCISSORS MNPLR CVD DVNC XI (INSTRUMENTS) ×2 IMPLANT
SEAL UNIV 5-12 XI (MISCELLANEOUS) ×6 IMPLANT
SET TUBE SMOKE EVAC HIGH FLOW (TUBING) ×2 IMPLANT
SOLUTION ELECTROSURG ANTI STCK (MISCELLANEOUS) ×2 IMPLANT
SUT ETHIBOND NAB MO 7 #0 18IN (SUTURE) ×2 IMPLANT
SUT SILK 3 0 12 30 (SUTURE) IMPLANT
SUT STRATA 3-0 15 RB-1.5 (SUTURE) ×2 IMPLANT
SUT VIC AB 2-0 SH 27XBRD (SUTURE) ×2 IMPLANT
SUT VIC AB 3-0 SH 27X BRD (SUTURE) ×2 IMPLANT
SUT VICRYL 0 UR6 27IN ABS (SUTURE) IMPLANT
SUTURE MNCRL 4-0 27XMF (SUTURE) ×2 IMPLANT
SYR 10ML LL (SYRINGE) ×4 IMPLANT
SYR 30ML LL (SYRINGE) ×2 IMPLANT
TAPE TRANSPORE STRL 2 31045 (GAUZE/BANDAGES/DRESSINGS) ×2 IMPLANT
TOWEL OR 17X26 4PK STRL BLUE (TOWEL DISPOSABLE) ×2 IMPLANT
TRAP FLUID SMOKE EVACUATOR (MISCELLANEOUS) ×2 IMPLANT
WATER STERILE IRR 500ML POUR (IV SOLUTION) ×2 IMPLANT

## 2024-08-06 NOTE — Interval H&P Note (Signed)
 No change. Ok to proceed

## 2024-08-06 NOTE — Anesthesia Preprocedure Evaluation (Addendum)
 Anesthesia Evaluation  Patient identified by MRN, date of birth, ID band Patient awake    Reviewed: Allergy & Precautions, NPO status , Patient's Chart, lab work & pertinent test results  History of Anesthesia Complications Negative for: history of anesthetic complications  Airway Mallampati: IV   Neck ROM: Full    Dental  (+) Missing   Pulmonary COPD, former smoker (quit 2013)   Pulmonary exam normal breath sounds clear to auscultation       Cardiovascular hypertension, Normal cardiovascular exam Rhythm:Regular Rate:Normal  ECG 08/02/24: normal   Neuro/Psych negative neurological ROS     GI/Hepatic ,GERD  ,,Ulcerative colitis   Endo/Other  Obesity; prediabetes  Renal/GU negative Renal ROS     Musculoskeletal  (+) Arthritis ,    Abdominal   Peds  Hematology negative hematology ROS (+)   Anesthesia Other Findings   Reproductive/Obstetrics                              Anesthesia Physical Anesthesia Plan  ASA: 2  Anesthesia Plan: General   Post-op Pain Management:    Induction: Intravenous  PONV Risk Score and Plan: 2 and Ondansetron , Dexamethasone  and Treatment may vary due to age or medical condition  Airway Management Planned: Oral ETT  Additional Equipment:   Intra-op Plan:   Post-operative Plan: Extubation in OR  Informed Consent: I have reviewed the patients History and Physical, chart, labs and discussed the procedure including the risks, benefits and alternatives for the proposed anesthesia with the patient or authorized representative who has indicated his/her understanding and acceptance.     Dental advisory given  Plan Discussed with: CRNA  Anesthesia Plan Comments: (Patient consented for risks of anesthesia including but not limited to:  - adverse reactions to medications - damage to eyes, teeth, lips or other oral mucosa - nerve damage due to positioning   - sore throat or hoarseness - damage to heart, brain, nerves, lungs, other parts of body or loss of life  Informed patient about role of CRNA in peri- and intra-operative care.  Patient voiced understanding.)         Anesthesia Quick Evaluation

## 2024-08-06 NOTE — Anesthesia Postprocedure Evaluation (Signed)
 Anesthesia Post Note  Patient: Jason Dickson  Procedure(s) Performed: REPAIR, HERNIA, UMBILICAL, ADULT (Abdomen) REPAIR, HERNIA, INGUINAL, ROBOT-ASSISTED, LAPAROSCOPIC, USING MESH (Right: Abdomen)  Patient location during evaluation: PACU Anesthesia Type: General Level of consciousness: awake and alert, oriented and patient cooperative Pain management: pain level controlled Vital Signs Assessment: post-procedure vital signs reviewed and stable Respiratory status: spontaneous breathing, nonlabored ventilation and respiratory function stable Cardiovascular status: blood pressure returned to baseline and stable Postop Assessment: adequate PO intake Anesthetic complications: no   There were no known notable events for this encounter.   Last Vitals:  Vitals:   08/06/24 1203 08/06/24 1419  BP: 127/87 (!) 117/54  Pulse: 61 71  Resp:  17  Temp: (!) 36.2 C (!) 36.2 C  SpO2: 100% 98%    Last Pain:  Vitals:   08/06/24 1445  TempSrc:   PainSc: 8                  Samiha Denapoli

## 2024-08-06 NOTE — Op Note (Signed)
 Preoperative diagnosis: Right, initial, reducible inguinal Hernia, initial, reducible, umbilical hernia   Postoperative diagnosis: Same  Procedure: Robotic assisted laparoscopic right inguinal hernia repair with mesh, open primary repair of umbilical hernia  Anesthesia: General  Surgeon: Dr. Tye  Wound Classification: Clean  Specimen: none  Complications: None  Estimated Blood Loss: 10mL   Indications:  inguinal hernia. Repair was indicated to avoid complications of incarceration, obstruction and pain, and a prosthetic mesh repair was elected.  See H&P for further details.  Findings: 1. Vas Deferens and cord structures identified and preserved 2. Bard 3D max medium weight mesh used for repair 3. Adequate hemostasis achieved  Description of procedure: The patient was taken to the operating room. A time-out was completed verifying correct patient, procedure, site, positioning, and implant(s) and/or special equipment prior to beginning this procedure.  Area was prepped and draped in the usual sterile fashion. An incision was marked 20 cm above the pubic tubercle, slightly above the umbilicus  Scrotum wrapped in Kerlix roll.  Veress needle inserted at palmer's point.  Saline drop test noted to be positive with gradual increase in pressure after initiation of gas insufflation.  15 mm of pressure was achieved prior to removing the Veress needle and then placing a 8 mm port via the Optiview technique through the supraumbilical site.  Inspection of the area afterwards noted no injury to the surrounding organs during insertion of the needle and the port.  2 port sites were marked 8 cm to the lateral sides of the initial port, and a 8 mm robotic port was placed on the left side, another 8 mm robotic port on the right side under direct supervision.  Local anesthesia  infused to the preplanned incision sites prior to insertion of the port.  The [employer] platform was then brought into the  operative field and docked to the ports.  Examination of the abdominal cavity noted a right inguinal hernia.  No left inguinal hernia.  A peritoneal flap was created approximately 8cm cephalad to the defect by using scissors with electrocautery.  Dissection was carried down towards the pubic tubercle, developing the myopectineal orifice view.  Laterally the flap was carried towards the ASIS.  Small hernia sac was noted, which carefully dissected away from the adjacent tissues to be fully reduced out of hernia cavity.  Any bleeding was controlled with combination of electrocautery and manual pressure.    After confirming adequate dissection and the peritoneal reflection completely down and away from the cord structures, a Large Bard 3DMax medium weight mesh was placed within the anterior abdominal wall, secured in place using 2-0 Vicryl on an SH needle immediately above the pubic tubercle.  After noting proper placement of the mesh with the peritoneal reflection deep to it, the previously created peritoneal flap was secured back up to the anterior abdominal wall using running 3-0 V-Lock.  Both needles were then removed out of the abdominal cavity, Xi platform undocked from the ports and removed off of operative field.  Abdomen then desufflated and ports removed.   Attention turned to the umbilical hernia. An infraumbilical incision was made after infusing the preplanned incision with half percent Marcaine .  Dissection carried down to fascia where the umbilical stalk was noted.  The stalk was transected and there was noted to be a 1.2cm x 1.5cm umbilical hernia.  The preperitoneal fat contents were dissected off the surrounding structures and reduced.  Hemostasis was confirmed prior to reducing the actual contents.  The defect  itself was primary closed using 0 Vicryl in an interrupted fashion.  The fascia as well as the skin incision was then infused with local    After confirming hemostasis, the umbilical  stalk was reattached to the abdominal wall using 2-0 Vicryl and the wound was irrigated and closed in a multilayer fashion, using 3-0 Vicryl for the deep dermal layer in an interrupted fashion and running 4-0   Remaining port site skin incisions were then closed with a subcuticular stitch of Monocryl 4-0. Dermabond was applied. The testis was gently pulled down into its anatomic position in the scrotum.  The patient tolerated the procedure well and was taken to the postanesthesia care unit in stable condition. Sponge and instrument count correct at end of procedure.

## 2024-08-06 NOTE — Anesthesia Procedure Notes (Signed)
 Procedure Name: Intubation Date/Time: 08/06/2024 12:51 PM  Performed by: Norleen Alberta HERO., CRNAPre-anesthesia Checklist: Patient identified, Patient being monitored, Timeout performed, Emergency Drugs available and Suction available Patient Re-evaluated:Patient Re-evaluated prior to induction Oxygen Delivery Method: Circle system utilized Preoxygenation: Pre-oxygenation with 100% oxygen Induction Type: IV induction Ventilation: Mask ventilation without difficulty Laryngoscope Size: Mac and 3 Grade View: Grade I Tube type: Oral Tube size: 7.5 mm Number of attempts: 1 Airway Equipment and Method: Stylet Placement Confirmation: ETT inserted through vocal cords under direct vision, positive ETCO2 and breath sounds checked- equal and bilateral Secured at: 22 cm Tube secured with: Tape Dental Injury: Teeth and Oropharynx as per pre-operative assessment

## 2024-08-06 NOTE — Discharge Instructions (Signed)
 Hernia repair, Care After ?This sheet gives you information about how to care for yourself after your procedure. Your health care provider may also give you more specific instructions. If you have problems or questions, contact your health care provider. ?What can I expect after the procedure? ?After your procedure, it is common to have the following: ?Pain in your abdomen, especially in the incision areas. You will be given medicine to control the pain. ?Tiredness. This is a normal part of the recovery process. Your energy level will return to normal over the next several weeks. ?Changes in your bowel movements, such as constipation or needing to go more often. Talk with your health care provider about how to manage this. ?Follow these instructions at home: ?Medicines ? tylenol and advil as needed for discomfort.  Please alternate between the two every four hours as needed for pain.   ? Use narcotics, if prescribed, only when tylenol and motrin is not enough to control pain. ? 325-650mg  every 8hrs to max of 3000mg /24hrs (including the 325mg  in every norco dose) for the tylenol.   ? Advil up to 800mg  per dose every 8hrs as needed for pain.   ?PLEASE RECORD NUMBER OF PILLS TAKEN UNTIL NEXT FOLLOW UP APPT.  THIS WILL HELP DETERMINE HOW READY YOU ARE TO BE RELEASED FROM ANY ACTIVITY RESTRICTIONS ?Do not drive or use heavy machinery while taking prescription pain medicine. ?Do not drink alcohol while taking prescription pain medicine. ? ?Incision care ? ?  ?Follow instructions from your health care provider about how to take care of your incision areas. Make sure you: ?Keep your incisions clean and dry. ?Wash your hands with soap and water before and after applying medicine to the areas, and before and after changing your bandage (dressing). If soap and water are not available, use hand sanitizer. ?Change your dressing as told by your health care provider. ?Leave stitches (sutures), skin glue, or adhesive strips in  place. These skin closures may need to stay in place for 2 weeks or longer. If adhesive strip edges start to loosen and curl up, you may trim the loose edges. Do not remove adhesive strips completely unless your health care provider tells you to do that. ?Do not wear tight clothing over the incisions. Tight clothing may rub and irritate the incision areas, which may cause the incisions to open. ?Do not take baths, swim, or use a hot tub until your health care provider approves. OK TO SHOWER IN 24HRS.   ?Check your incision area every day for signs of infection. Check for: ?More redness, swelling, or pain. ?More fluid or blood. ?Warmth. ?Pus or a bad smell. ?Activity ?Avoid lifting anything that is heavier than 10 lb (4.5 kg) for 2 weeks or until your health care provider says it is okay. ?No pushing/pulling greater than 30lbs ?You may resume normal activities as told by your health care provider. Ask your health care provider what activities are safe for you. ?Take rest breaks during the day as needed. ?Eating and drinking ?Follow instructions from your health care provider about what you can eat after surgery. ?To prevent or treat constipation while you are taking prescription pain medicine, your health care provider may recommend that you: ?Drink enough fluid to keep your urine clear or pale yellow. ?Take over-the-counter or prescription medicines. ?Eat foods that are high in fiber, such as fresh fruits and vegetables, whole grains, and beans. ?Limit foods that are high in fat and processed sugars, such as fried and  sweet foods. ?General instructions ?Ask your health care provider when you will need an appointment to get your sutures or staples removed. ?Keep all follow-up visits as told by your health care provider. This is important. ?Contact a health care provider if: ?You have more redness, swelling, or pain around your incisions. ?You have more fluid or blood coming from the incisions. ?Your incisions feel  warm to the touch. ?You have pus or a bad smell coming from your incisions or your dressing. ?You have a fever. ?You have an incision that breaks open (edges not staying together) after sutures or staples have been removed. ?You develop a rash. ?You have chest pain or difficulty breathing. ?You have pain or swelling in your legs. ?You feel light-headed or you faint. ?Your abdomen swells (becomes distended). ?You have nausea or vomiting. ?You have blood in your stool (feces). ?This information is not intended to replace advice given to you by your health care provider. Make sure you discuss any questions you have with your health care provider. ?Document Released: 05/03/2005 Document Revised: 07/03/2018 Document Reviewed: 07/15/2016 ?Elsevier Interactive Patient Education ? 2019 [employer] ?  ? ?

## 2024-08-06 NOTE — Transfer of Care (Signed)
 Immediate Anesthesia Transfer of Care Note  Patient: Jason Dickson  Procedure(s) Performed: REPAIR, HERNIA, UMBILICAL, ADULT (Abdomen) REPAIR, HERNIA, INGUINAL, ROBOT-ASSISTED, LAPAROSCOPIC, USING MESH (Right: Abdomen)  Patient Location: PACU  Anesthesia Type:General  Level of Consciousness: awake, alert , and patient cooperative  Airway & Oxygen Therapy: Patient Spontanous Breathing  Post-op Assessment: Report given to RN and Post -op Vital signs reviewed and stable  Post vital signs: stable  Last Vitals:  Vitals Value Taken Time  BP    Temp    Pulse 70 08/06/24 14:24  Resp 13 08/06/24 14:24  SpO2 99 % 08/06/24 14:24  Vitals shown include unfiled device data.  Last Pain:  Vitals:   08/06/24 1203  TempSrc: Temporal  PainSc: 8          Complications: No notable events documented.

## 2024-08-07 ENCOUNTER — Emergency Department
Admission: EM | Admit: 2024-08-07 | Discharge: 2024-08-07 | Disposition: A | Discharge status: Home / Self Care | Attending: Emergency Medicine | Admitting: Emergency Medicine

## 2024-08-07 ENCOUNTER — Encounter: Payer: Self-pay | Admitting: Emergency Medicine

## 2024-08-07 ENCOUNTER — Other Ambulatory Visit: Payer: Self-pay

## 2024-08-07 DIAGNOSIS — T8130XA Disruption of wound, unspecified, initial encounter: Secondary | ICD-10-CM | POA: Diagnosis present

## 2024-08-07 DIAGNOSIS — Z4801 Encounter for change or removal of surgical wound dressing: Secondary | ICD-10-CM | POA: Insufficient documentation

## 2024-08-07 DIAGNOSIS — Y733 Surgical instruments, materials and gastroenterology and urology devices (including sutures) associated with adverse incidents: Secondary | ICD-10-CM | POA: Insufficient documentation

## 2024-08-07 DIAGNOSIS — Z5189 Encounter for other specified aftercare: Secondary | ICD-10-CM

## 2024-08-07 MED ORDER — ONDANSETRON 4 MG PO TBDP
8.0000 mg | ORAL_TABLET | Freq: Once | ORAL | Status: AC
  Administered 2024-08-07: 8 mg via ORAL
  Filled 2024-08-07: qty 2

## 2024-08-07 NOTE — ED Provider Notes (Signed)
 [facility] Provider Note   Event Date/Time   First MD Initiated Contact with Patient 08/07/24 0935     (approximate) History  Post-op Problem  HPI Jason Dickson is a 63 y.o. male with a recent past surgical history of umbilical and right-sided inguinal hernia repair yesterday with Dr. Tye who presents after a small amount of oozing blood from his umbilical repair site has started this morning.  Patient also states that he had an episode of nausea/vomiting yesterday after the surgery however he has been p.o. tolerant since then.  Patient states that his incision has been oozing dark red blood to the point that it is dripping and ruining his shirts.  Patient denies any pulsatile bleeding and denies any blood thinning medications. ROS: Patient currently denies any vision changes, tinnitus, difficulty speaking, facial droop, sore throat, chest pain, shortness of breath, abdominal pain, nausea/vomiting/diarrhea, dysuria, or weakness/numbness/paresthesias in any extremity   Physical Exam  Triage Vital Signs: ED Triage Vitals  Encounter Vitals Group     BP 08/07/24 0925 (!) 127/52     Girls Systolic BP Percentile --      Girls Diastolic BP Percentile --      Boys Systolic BP Percentile --      Boys Diastolic BP Percentile --      Pulse Rate 08/07/24 0925 65     Resp 08/07/24 0925 16     Temp 08/07/24 0925 (!) 97.5 F (36.4 C)     Temp Source 08/07/24 0925 Oral     SpO2 08/07/24 0925 98 %     Weight 08/07/24 0924 215 lb (97.5 kg)     Height 08/07/24 0924 5' 11 (1.803 m)     Head Circumference --      Peak Flow --      Pain Score 08/07/24 0923 7     Pain Loc --      Pain Education --      Exclude from Growth Chart --    Most recent vital signs: Vitals:   08/07/24 0925  BP: (!) 127/52  Pulse: 65  Resp: 16  Temp: (!) 97.5 F (36.4 C)  SpO2: 98%   General: Awake, oriented x4. CV:  Good peripheral perfusion. Resp:  Normal effort. Abd:  No  distention.  Multiple trocar sites surrounding the umbilicus with a 2.5 cm incision along the inferior aspect of the umbilicus with a small area (subcentimeter) of dehiscence on the right lateral aspect and is hemostatic at this time.  No significant surrounding erythema or any purulent drainage Other:  Middle-aged overweight Caucasian male resting comfortably in no acute distress ED Results / Procedures / Treatments  Labs (all labs ordered are listed, but only abnormal results are displayed) Labs Reviewed - No data to display PROCEDURES: Critical Care performed: No Procedures MEDICATIONS ORDERED IN ED: Medications  ondansetron  (ZOFRAN -ODT) disintegrating tablet 8 mg (has no administration in time range)   IMPRESSION / MDM / ASSESSMENT AND PLAN / ED COURSE  I reviewed the triage vital signs and the nursing notes.                             The patient is on the cardiac monitor to evaluate for evidence of arrhythmia and/or significant heart rate changes. Patient's presentation is most consistent with acute presentation with potential threat to life or bodily function. Patient is a 63 year old male with the above-stated past medical history  and is 1 day status post laparoscopic and open repair of hernias yesterday.  On physical exam, patient has a small area of wound dehiscence that was repaired at bedside with tissue adhesive.  Patient was monitored with no further oozing bleeding.  Patient agrees with plan for discharge home at this time and follow-up with the general surgeon as planned.  Patient given strict return precautions and all questions answered prior to discharge. Dispo: Discharge home with general surgical follow-up    FINAL CLINICAL IMPRESSION(S) / ED DIAGNOSES   Final diagnoses:  Wound dehiscence  Visit for wound check   Rx / DC Orders   ED Discharge Orders     None      Note:  This document was prepared using Dragon voice recognition software and may include  unintentional dictation errors.   Alfreda Hammad K, MD 08/07/24 1004

## 2024-08-07 NOTE — ED Triage Notes (Signed)
 Pt via POV from home. Pt was here yesterday with Dr. Tye for a hernia repair surgery. Per wife, approx an hour after he got home pt's incision site started to bleed. Wife states it continues to slowly ooze blood, bleeding controlled. Pt also c/o abd soreness and nausea vomiting. Denies fever. Pt is A&Ox4 and NAD, ambulatory to triage with steady gait

## 2024-08-09 ENCOUNTER — Encounter: Payer: Self-pay | Admitting: Surgery

## 2024-09-17 ENCOUNTER — Ambulatory Visit

## 2024-09-17 ENCOUNTER — Other Ambulatory Visit (HOSPITAL_COMMUNITY): Payer: Self-pay

## 2024-09-28 ENCOUNTER — Other Ambulatory Visit: Payer: Self-pay | Admitting: Orthopedic Surgery

## 2024-10-08 ENCOUNTER — Inpatient Hospital Stay: Admission: RE | Admit: 2024-10-08 | Discharge: 2024-10-08 | Attending: Orthopedic Surgery

## 2024-10-08 ENCOUNTER — Other Ambulatory Visit: Payer: Self-pay

## 2024-10-08 VITALS — BP 130/62 | HR 61 | Resp 16 | Ht 71.0 in | Wt 208.0 lb

## 2024-10-08 DIAGNOSIS — Z01818 Encounter for other preprocedural examination: Secondary | ICD-10-CM

## 2024-10-08 LAB — TYPE AND SCREEN
ABO/RH(D): A NEG
Antibody Screen: NEGATIVE

## 2024-10-08 LAB — URINALYSIS, ROUTINE W REFLEX MICROSCOPIC
Bilirubin Urine: NEGATIVE
Glucose, UA: NEGATIVE mg/dL
Hgb urine dipstick: NEGATIVE
Ketones, ur: NEGATIVE mg/dL
Leukocytes,Ua: NEGATIVE
Nitrite: NEGATIVE
Protein, ur: NEGATIVE mg/dL
Specific Gravity, Urine: 1.012 (ref 1.005–1.030)
pH: 5 (ref 5.0–8.0)

## 2024-10-08 LAB — SURGICAL PCR SCREEN
MRSA, PCR: NEGATIVE
Staphylococcus aureus: POSITIVE — AB

## 2024-10-08 NOTE — Patient Instructions (Addendum)
 Your procedure is scheduled on: Monday 10/18/24 Report to the Registration Desk on the 1st floor of the [facility]. To find out your arrival time, please call [phone] between 1PM - 3PM on: Friday 10/15/24 If your arrival time is 6:00 am, do not arrive before that time as the [facility] entrance doors do not open until 6:00 am.  REMEMBER: Instructions that are not followed completely may result in serious medical risk, up to and including death; or upon the discretion of your surgeon and anesthesiologist your surgery may need to be rescheduled.  Do not eat food after midnight the night before surgery.  No gum chewing or hard candies.  You may however, drink CLEAR liquids up to 2 hours before you are scheduled to arrive for your surgery. Do not drink anything within 2 hours of your scheduled arrival time.  Clear liquids include: - water  - apple juice without pulp - gatorade (not RED colors) - black coffee or tea (Do NOT add milk or creamers to the coffee or tea) Do NOT drink anything that is not on this list.  In addition, your doctor has ordered for you to drink the provided:  Gatorade  Drinking this up to two hours before surgery helps to reduce insulin resistance and improve patient outcomes. Please complete drinking 2 hours before scheduled arrival time.  One week prior to surgery: Stop Anti-inflammatories (NSAIDS) such as Advil , Aleve , Ibuprofen , Motrin , Naproxen , Naprosyn  and Aspirin  based products such as Excedrin, Goody's Powder, BC Powder.  You may however, continue to take Tylenol  if needed for pain up until the day of surgery.  Stop ANY OVER THE COUNTER supplements and vitamins for 1 week prior to surgery.  Continue taking all of your other prescription medications up until the day of surgery.  ON THE DAY OF SURGERY ONLY TAKE THESE MEDICATIONS WITH SIPS OF WATER:  loratadine (CLARITIN) 10 MG tablet  mesalamine (LIALDA) 1.2 g EC tablet  metronidazole (FLAGYL)  375 MG capsule  omeprazole (PRILOSEC) 20 MG capsule   No Alcohol for 24 hours before or after surgery.  No Smoking including e-cigarettes for 24 hours before surgery.  No chewable tobacco products for at least 6 hours before surgery.  No nicotine patches on the day of surgery.  Do not use any recreational drugs for at least a week (preferably 2 weeks) before your surgery.  Please be advised that the combination of cocaine and anesthesia may have negative outcomes, up to and including death. If you test positive for cocaine, your surgery will be cancelled.  On the morning of surgery brush your teeth with toothpaste and water, you may rinse your mouth with mouthwash if you wish. Do not swallow any toothpaste or mouthwash.  Use CHG Soap or wipes as directed on instruction sheet.  Do not shave body hair from the neck down 48 hours before surgery.  Do not wear lotions, powders, or perfumes.   Wear comfortable clothing (specific to your surgery type) to the hospital.  Do not wear jewelry, make-up, hairpins, clips or nail polish.  For welded (permanent) jewelry: bracelets, anklets, waist bands, etc.  Please have this removed prior to surgery.  If it is not removed, there is a chance that hospital personnel will need to cut it off on the day of surgery.  Contact lenses, hearing aids and dentures may not be worn into surgery. Bring a case for your glasses  Do not bring valuables to the hospital. [facility] is not responsible  for any missing/lost belongings or valuables.   Notify your doctor if there is any change in your medical condition (cold, fever, infection).  After surgery, you can help prevent lung complications by doing breathing exercises.  Take deep breaths and cough every 1-2 hours. Your doctor may order a device called an Incentive Spirometer to help you take deep breaths.  If you are being admitted to the hospital overnight, leave your suitcase in the car. After surgery it  may be brought to your room.  In case of increased patient census, it may be necessary for you, the patient, to continue your postoperative care in the Same Day Surgery department.  Please call the Pre-admissions Testing Dept. at [phone] if you have any questions about these instructions.  Surgery Visitation Policy:  Patients having surgery or a procedure may have two visitors.  Children under the age of 67 must have an adult with them who is not the patient.  Inpatient Visitation:    Visiting hours are 7 a.m. to 8 p.m. Up to four visitors are allowed at one time in a patient room. The visitors may rotate out with other people during the day.  One visitor age 17 or older may stay with the patient overnight and must be in the room by 8 p.m.   Merchandiser, Retail to address health-related social needs:  [email or web address]    Pre-operative 4 CHG Bath Instructions   You can play a key role in reducing the risk of infection after surgery. Your skin needs to be as free of germs as possible. You can reduce the number of germs on your skin by washing with CHG (chlorhexidine  gluconate) soap before surgery. CHG is an antiseptic soap that kills germs and continues to kill germs even after washing.   DO NOT use if you have an allergy to chlorhexidine /CHG or antibacterial soaps. If your skin becomes reddened or irritated, stop using the CHG and notify one of our RNs at [phone].   Please shower with the CHG soap starting 4 days before surgery using the following schedule:   Thursday 10/14/24 - Monday 10/18/24    Please keep in mind the following:  DO NOT shave, including legs and underarms, starting the day of your first shower.   You may shave your face at any point before/day of surgery.  Place clean sheets on your bed the day you start using CHG soap. Use a clean washcloth (not used since being washed) for each shower. DO NOT sleep with pets once you  start using the CHG.   CHG Shower Instructions:  If you choose to wash your hair and private area, wash first with your normal shampoo/soap.  After you use shampoo/soap, rinse your hair and body thoroughly to remove shampoo/soap residue.  Turn the water OFF and apply about 3 tablespoons (45 ml) of CHG soap to a CLEAN washcloth.  Apply CHG soap ONLY FROM YOUR NECK DOWN TO YOUR TOES (washing for 3-5 minutes)  DO NOT use CHG soap on face, private areas, open wounds, or sores.  Pay special attention to the area where your surgery is being performed.  If you are having back surgery, having someone wash your back for you may be helpful. Wait 2 minutes after CHG soap is applied, then you may rinse off the CHG soap.  Pat dry with a clean towel  Put on clean clothes/pajamas   If you choose to wear lotion, please use ONLY the CHG-compatible lotions on the back  of this paper.     Additional instructions for the day of surgery: DO NOT APPLY any lotions, deodorants, cologne, or perfumes.   Put on clean/comfortable clothes.  Brush your teeth.  Ask your nurse before applying any prescription medications to the skin.      CHG Compatible Lotions   Aveeno Moisturizing lotion  Cetaphil Moisturizing Cream  Cetaphil Moisturizing Lotion  Clairol Herbal Essence Moisturizing Lotion, Dry Skin  Clairol Herbal Essence Moisturizing Lotion, Extra Dry Skin  Clairol Herbal Essence Moisturizing Lotion, Normal Skin  Curel Age Defying Therapeutic Moisturizing Lotion with Alpha Hydroxy  Curel Extreme Care Body Lotion  Curel Soothing Hands Moisturizing Hand Lotion  Curel Therapeutic Moisturizing Cream, Fragrance-Free  Curel Therapeutic Moisturizing Lotion, Fragrance-Free  Curel Therapeutic Moisturizing Lotion, Original Formula  Eucerin Daily Replenishing Lotion  Eucerin Dry Skin Therapy Plus Alpha Hydroxy Crme  Eucerin Dry Skin Therapy Plus Alpha Hydroxy Lotion  Eucerin Original Crme  Eucerin Original  Lotion  Eucerin Plus Crme Eucerin Plus Lotion  Eucerin TriLipid Replenishing Lotion  Keri Anti-Bacterial Hand Lotion  Keri Deep Conditioning Original Lotion Dry Skin Formula Softly Scented  Keri Deep Conditioning Original Lotion, Fragrance Free Sensitive Skin Formula  Keri Lotion Fast Absorbing Fragrance Free Sensitive Skin Formula  Keri Lotion Fast Absorbing Softly Scented Dry Skin Formula  Keri Original Lotion  Keri Skin Renewal Lotion Keri Silky Smooth Lotion  Keri Silky Smooth Sensitive Skin Lotion  Nivea Body Creamy Conditioning Oil  Nivea Body Extra Enriched Lotion  Nivea Body Original Lotion  Nivea Body Sheer Moisturizing Lotion Nivea Crme  Nivea Skin Firming Lotion  NutraDerm 30 Skin Lotion  NutraDerm Skin Lotion  NutraDerm Therapeutic Skin Cream  NutraDerm Therapeutic Skin Lotion  ProShield Protective Hand Cream  Provon moisturizing lotion  How to Use an Incentive Spirometer  An incentive spirometer is a tool that measures how well you are filling your lungs with each breath. Learning to take long, deep breaths using this tool can help you keep your lungs clear and active. This may help to reverse or lessen your chance of developing breathing (pulmonary) problems, especially infection. You may be asked to use a spirometer: After a surgery. If you have a lung problem or a history of smoking. After a long period of time when you have been unable to move or be active. If the spirometer includes an indicator to show the highest number that you have reached, your health care provider or respiratory therapist will help you set a goal. Keep a log of your progress as told by your health care provider. What are the risks? Breathing too quickly may cause dizziness or cause you to pass out. Take your time so you do not get dizzy or light-headed. If you are in pain, you may need to take pain medicine before doing incentive spirometry. It is harder to take a deep breath if you are  having pain.   Sit up on the edge of your bed or on a chair. Hold the incentive spirometer so that it is in an upright position. Before you use the spirometer, breathe out normally. Place the mouthpiece in your mouth. Make sure your lips are closed tightly around it. Breathe in slowly and as deeply as you can through your mouth, causing the piston or the ball to rise toward the top of the chamber. Hold your breath for 3-5 seconds, or for as long as possible. If the spirometer includes a coach indicator, use this to guide you in breathing. Slow  down your breathing if the indicator goes above the marked areas. Remove the mouthpiece from your mouth and breathe out normally. The piston or ball will return to the bottom of the chamber. Rest for a few seconds, then repeat the steps 10 or more times. Take your time and take a few normal breaths between deep breaths so that you do not get dizzy or light-headed. Do this every 1-2 hours when you are awake. If the spirometer includes a goal marker to show the highest number you have reached (best effort), use this as a goal to work toward during each repetition. After each set of 10 deep breaths, cough a few times. This will help to make sure that your lungs are clear. If you have an incision on your chest or abdomen from surgery, place a pillow or a rolled-up towel firmly against the incision when you cough. This can help to reduce pain while taking deep breaths and coughing. General tips When you are able to get out of bed: Walk around often. Continue to take deep breaths and cough in order to clear your lungs. Keep using the incentive spirometer until your health care provider says it is okay to stop using it. If you have been in the hospital, you may be told to keep using the spirometer at home. Contact a health care provider if: You are having difficulty using the spirometer. You have trouble using the spirometer as often as instructed. Your pain  medicine is not giving enough relief for you to use the spirometer as told. You have a fever. Get help right away if: You develop shortness of breath. You develop a cough with bloody mucus from the lungs. You have fluid or blood coming from an incision site after you cough. Summary An incentive spirometer is a tool that can help you learn to take long, deep breaths to keep your lungs clear and active. You may be asked to use a spirometer after a surgery, if you have a lung problem or a history of smoking, or if you have been inactive for a long period of time. Use your incentive spirometer as instructed every 1-2 hours while you are awake. If you have an incision on your chest or abdomen, place a pillow or a rolled-up towel firmly against your incision when you cough. This will help to reduce pain. Get help right away if you have shortness of breath, you cough up bloody mucus, or blood comes from your incision when you cough. This information is not intended to replace advice given to you by your health care provider. Make sure you discuss any questions you have with your health care provider. Document Revised: 01/03/2020 Document Reviewed: 01/03/2020 Elsevier Patient Education  2023 [employer].

## 2024-10-17 MED ORDER — LACTATED RINGERS IV SOLN: INTRAVENOUS | Status: DC

## 2024-10-17 MED ORDER — ORAL CARE MOUTH RINSE: 15.0000 mL | Freq: Once | OROMUCOSAL | Status: AC

## 2024-10-17 MED ORDER — CHLORHEXIDINE GLUCONATE 0.12 % MT SOLN
15.0000 mL | Freq: Once | OROMUCOSAL | Status: AC
  Administered 2024-10-18: 15 mL via OROMUCOSAL

## 2024-10-17 MED ORDER — CEFAZOLIN SODIUM-DEXTROSE 2-4 GM/100ML-% IV SOLN
2.0000 g | INTRAVENOUS | Status: AC
  Administered 2024-10-18: 2 g via INTRAVENOUS

## 2024-10-17 MED ORDER — TRANEXAMIC ACID-NACL 1000-0.7 MG/100ML-% IV SOLN
1000.0000 mg | INTRAVENOUS | Status: AC
  Administered 2024-10-18 (×2): 1000 mg via INTRAVENOUS

## 2024-10-17 MED ORDER — DEXAMETHASONE SOD PHOSPHATE PF 10 MG/ML IJ SOLN
8.0000 mg | Freq: Once | INTRAMUSCULAR | Status: AC
  Administered 2024-10-18: 8 mg via INTRAVENOUS

## 2024-10-18 ENCOUNTER — Ambulatory Visit: Payer: Self-pay | Admitting: Urgent Care

## 2024-10-18 ENCOUNTER — Other Ambulatory Visit: Payer: Self-pay

## 2024-10-18 ENCOUNTER — Ambulatory Visit

## 2024-10-18 ENCOUNTER — Encounter: Payer: Self-pay | Admitting: Urgent Care

## 2024-10-18 ENCOUNTER — Encounter: Payer: Self-pay | Admitting: Orthopedic Surgery

## 2024-10-18 ENCOUNTER — Ambulatory Visit
Admission: RE | Admit: 2024-10-18 | Discharge: 2024-10-19 | Disposition: A | Discharge status: Home Health | Source: Ambulatory Visit | Attending: Orthopedic Surgery | Admitting: Orthopedic Surgery

## 2024-10-18 ENCOUNTER — Encounter: Admission: RE | Disposition: A | Payer: Self-pay | Attending: Orthopedic Surgery

## 2024-10-18 DIAGNOSIS — E669 Obesity, unspecified: Secondary | ICD-10-CM | POA: Insufficient documentation

## 2024-10-18 DIAGNOSIS — M1990 Unspecified osteoarthritis, unspecified site: Secondary | ICD-10-CM | POA: Insufficient documentation

## 2024-10-18 DIAGNOSIS — K219 Gastro-esophageal reflux disease without esophagitis: Secondary | ICD-10-CM | POA: Insufficient documentation

## 2024-10-18 DIAGNOSIS — K5190 Ulcerative colitis, unspecified, without complications: Secondary | ICD-10-CM | POA: Insufficient documentation

## 2024-10-18 DIAGNOSIS — I10 Essential (primary) hypertension: Secondary | ICD-10-CM | POA: Insufficient documentation

## 2024-10-18 DIAGNOSIS — Z87891 Personal history of nicotine dependence: Secondary | ICD-10-CM | POA: Insufficient documentation

## 2024-10-18 DIAGNOSIS — M1612 Unilateral primary osteoarthritis, left hip: Secondary | ICD-10-CM | POA: Insufficient documentation

## 2024-10-18 DIAGNOSIS — J439 Emphysema, unspecified: Secondary | ICD-10-CM | POA: Insufficient documentation

## 2024-10-18 DIAGNOSIS — Z6829 Body mass index (BMI) 29.0-29.9, adult: Secondary | ICD-10-CM | POA: Insufficient documentation

## 2024-10-18 DIAGNOSIS — Z96642 Presence of left artificial hip joint: Secondary | ICD-10-CM | POA: Diagnosis present

## 2024-10-18 HISTORY — PX: TOTAL HIP ARTHROPLASTY: SHX124

## 2024-10-18 LAB — ABO/RH: ABO/RH(D): A NEG

## 2024-10-18 SURGERY — ARTHROPLASTY, HIP, TOTAL, ANTERIOR APPROACH
Anesthesia: Spinal | Site: Hip | Laterality: Left

## 2024-10-18 MED ORDER — ENOXAPARIN SODIUM 40 MG/0.4ML IJ SOSY
40.0000 mg | PREFILLED_SYRINGE | INTRAMUSCULAR | Status: DC
  Administered 2024-10-19: 40 mg via SUBCUTANEOUS
  Filled 2024-10-18: qty 0.4

## 2024-10-18 MED ORDER — MORPHINE SULFATE (PF) 2 MG/ML IV SOLN: 0.5000 mg | INTRAVENOUS | Status: DC | PRN

## 2024-10-18 MED ORDER — OXYCODONE HCL 5 MG PO TABS
2.5000 mg | ORAL_TABLET | Freq: Three times a day (TID) | ORAL | 0 refills | Status: AC | PRN
  Filled 2024-10-18: qty 20, 7d supply, fill #0

## 2024-10-18 MED ORDER — ACETAMINOPHEN 10 MG/ML IV SOLN
INTRAVENOUS | Status: DC | PRN
  Administered 2024-10-18: 1000 mg via INTRAVENOUS

## 2024-10-18 MED ORDER — LORATADINE 10 MG PO TABS
10.0000 mg | ORAL_TABLET | Freq: Every day | ORAL | Status: DC
  Administered 2024-10-19: 10 mg via ORAL
  Filled 2024-10-18 (×2): qty 1

## 2024-10-18 MED ORDER — CEFAZOLIN SODIUM-DEXTROSE 2-4 GM/100ML-% IV SOLN
2.0000 g | Freq: Four times a day (QID) | INTRAVENOUS | Status: AC
  Administered 2024-10-18 (×2): 2 g via INTRAVENOUS

## 2024-10-18 MED ORDER — CEFAZOLIN SODIUM-DEXTROSE 2-4 GM/100ML-% IV SOLN
INTRAVENOUS | Status: AC
  Filled 2024-10-18: qty 100

## 2024-10-18 MED ORDER — TRANEXAMIC ACID-NACL 1000-0.7 MG/100ML-% IV SOLN
INTRAVENOUS | Status: AC
  Filled 2024-10-18: qty 100

## 2024-10-18 MED ORDER — BUPIVACAINE-EPINEPHRINE (PF) 0.25% -1:200000 IJ SOLN
INTRAMUSCULAR | Status: AC
  Filled 2024-10-18: qty 30

## 2024-10-18 MED ORDER — PHENYLEPHRINE HCL-NACL 20-0.9 MG/250ML-% IV SOLN
INTRAVENOUS | Status: AC
  Filled 2024-10-18: qty 250

## 2024-10-18 MED ORDER — SODIUM CHLORIDE (PF) 0.9 % IJ SOLN
INTRAMUSCULAR | Status: AC
  Filled 2024-10-18: qty 10

## 2024-10-18 MED ORDER — MENTHOL 3 MG MT LOZG: 1.0000 | LOZENGE | OROMUCOSAL | Status: DC | PRN

## 2024-10-18 MED ORDER — BUPIVACAINE LIPOSOME 1.3 % IJ SUSP
INTRAMUSCULAR | Status: AC
  Filled 2024-10-18: qty 20

## 2024-10-18 MED ORDER — PRAVASTATIN SODIUM 20 MG PO TABS
40.0000 mg | ORAL_TABLET | Freq: Every day | ORAL | Status: DC
  Administered 2024-10-18: 40 mg via ORAL
  Filled 2024-10-18: qty 2

## 2024-10-18 MED ORDER — ACETAMINOPHEN 10 MG/ML IV SOLN: 1000.0000 mg | Freq: Once | INTRAVENOUS | Status: DC | PRN

## 2024-10-18 MED ORDER — ACETAMINOPHEN 325 MG PO TABS: 325.0000 mg | ORAL_TABLET | Freq: Four times a day (QID) | ORAL | Status: DC | PRN

## 2024-10-18 MED ORDER — PROPOFOL 500 MG/50ML IV EMUL
INTRAVENOUS | Status: DC | PRN
  Administered 2024-10-18: 20 mg via INTRAVENOUS
  Administered 2024-10-18: 30 mg via INTRAVENOUS
  Administered 2024-10-18: 75 ug/kg/min via INTRAVENOUS
  Administered 2024-10-18: 30 mg via INTRAVENOUS
  Administered 2024-10-18: 20 mg via INTRAVENOUS

## 2024-10-18 MED ORDER — ONDANSETRON HCL 4 MG PO TABS
4.0000 mg | ORAL_TABLET | Freq: Four times a day (QID) | ORAL | 0 refills | Status: AC | PRN
  Filled 2024-10-18: qty 20, 21d supply, fill #0

## 2024-10-18 MED ORDER — MAGNESIUM OXIDE -MG SUPPLEMENT 400 (240 MG) MG PO TABS
400.0000 mg | ORAL_TABLET | Freq: Every day | ORAL | Status: DC
  Administered 2024-10-19: 400 mg via ORAL
  Filled 2024-10-18: qty 1

## 2024-10-18 MED ORDER — ONDANSETRON HCL 4 MG/2ML IJ SOLN: 4.0000 mg | Freq: Four times a day (QID) | INTRAMUSCULAR | Status: DC | PRN

## 2024-10-18 MED ORDER — LISINOPRIL 5 MG PO TABS
5.0000 mg | ORAL_TABLET | Freq: Every day | ORAL | Status: DC
  Administered 2024-10-18: 5 mg via ORAL

## 2024-10-18 MED ORDER — DOCUSATE SODIUM 100 MG PO CAPS
100.0000 mg | ORAL_CAPSULE | Freq: Two times a day (BID) | ORAL | Status: DC
  Administered 2024-10-18 – 2024-10-19 (×2): 100 mg via ORAL
  Filled 2024-10-18 (×2): qty 1

## 2024-10-18 MED ORDER — MIDAZOLAM HCL 2 MG/2ML IJ SOLN
INTRAMUSCULAR | Status: AC
  Filled 2024-10-18: qty 2

## 2024-10-18 MED ORDER — PANTOPRAZOLE SODIUM 40 MG PO TBEC
40.0000 mg | DELAYED_RELEASE_TABLET | Freq: Every day | ORAL | Status: DC
  Administered 2024-10-19: 40 mg via ORAL
  Filled 2024-10-18 (×2): qty 1

## 2024-10-18 MED ORDER — PHENOL 1.4 % MT LIQD: 1.0000 | OROMUCOSAL | Status: DC | PRN

## 2024-10-18 MED ORDER — SODIUM CHLORIDE (PF) 0.9 % IJ SOLN
INTRAMUSCULAR | Status: DC | PRN
  Administered 2024-10-18: 50 mL

## 2024-10-18 MED ORDER — CELECOXIB 200 MG PO CAPS
200.0000 mg | ORAL_CAPSULE | Freq: Two times a day (BID) | ORAL | 0 refills | Status: AC
  Filled 2024-10-18: qty 28, 14d supply, fill #0

## 2024-10-18 MED ORDER — PROPOFOL 1000 MG/100ML IV EMUL
INTRAVENOUS | Status: AC
  Filled 2024-10-18: qty 100

## 2024-10-18 MED ORDER — BUPIVACAINE HCL (PF) 0.5 % IJ SOLN
INTRAMUSCULAR | Status: DC | PRN
  Administered 2024-10-18: 2.6 mL via INTRATHECAL

## 2024-10-18 MED ORDER — SODIUM CHLORIDE 0.9 % IR SOLN
Status: DC | PRN
  Administered 2024-10-18: 100 mL

## 2024-10-18 MED ORDER — ONDANSETRON HCL 4 MG/2ML IJ SOLN
INTRAMUSCULAR | Status: DC | PRN
  Administered 2024-10-18: 4 mg via INTRAVENOUS

## 2024-10-18 MED ORDER — ONDANSETRON HCL 4 MG/2ML IJ SOLN
INTRAMUSCULAR | Status: AC
  Filled 2024-10-18: qty 2

## 2024-10-18 MED ORDER — OXYCODONE HCL 5 MG/5ML PO SOLN: 5.0000 mg | Freq: Once | ORAL | Status: DC | PRN

## 2024-10-18 MED ORDER — DROPERIDOL 2.5 MG/ML IJ SOLN: 0.6250 mg | Freq: Once | INTRAMUSCULAR | Status: DC | PRN

## 2024-10-18 MED ORDER — SODIUM CHLORIDE 0.9 % IV SOLN: INTRAVENOUS | Status: DC

## 2024-10-18 MED ORDER — ACETAMINOPHEN 10 MG/ML IV SOLN
INTRAVENOUS | Status: AC
  Filled 2024-10-18: qty 100

## 2024-10-18 MED ORDER — METOCLOPRAMIDE HCL 10 MG PO TABS: 5.0000 mg | ORAL_TABLET | Freq: Three times a day (TID) | ORAL | Status: DC | PRN

## 2024-10-18 MED ORDER — LISINOPRIL 5 MG PO TABS: 5.0000 mg | ORAL_TABLET | Freq: Every day | ORAL | Status: DC

## 2024-10-18 MED ORDER — LISINOPRIL 5 MG PO TABS
ORAL_TABLET | ORAL | Status: AC
  Filled 2024-10-18: qty 1

## 2024-10-18 MED ORDER — FENTANYL CITRATE (PF) 100 MCG/2ML IJ SOLN
INTRAMUSCULAR | Status: AC
  Filled 2024-10-18: qty 2

## 2024-10-18 MED ORDER — BUPIVACAINE HCL (PF) 0.5 % IJ SOLN
INTRAMUSCULAR | Status: AC
  Filled 2024-10-18: qty 10

## 2024-10-18 MED ORDER — OXYCODONE HCL 5 MG PO TABS: 5.0000 mg | ORAL_TABLET | Freq: Once | ORAL | Status: DC | PRN

## 2024-10-18 MED ORDER — FENTANYL CITRATE (PF) 100 MCG/2ML IJ SOLN
INTRAMUSCULAR | Status: DC | PRN
  Administered 2024-10-18 (×2): 50 ug via INTRAVENOUS

## 2024-10-18 MED ORDER — METOCLOPRAMIDE HCL 5 MG/ML IJ SOLN: 5.0000 mg | Freq: Three times a day (TID) | INTRAMUSCULAR | Status: DC | PRN

## 2024-10-18 MED ORDER — MUPIROCIN 2 % EX OINT: 1.0000 | TOPICAL_OINTMENT | Freq: Two times a day (BID) | CUTANEOUS | 0 refills | Status: DC

## 2024-10-18 MED ORDER — ACETAMINOPHEN 500 MG PO TABS
1000.0000 mg | ORAL_TABLET | Freq: Three times a day (TID) | ORAL | 0 refills | Status: AC
  Filled 2024-10-18: qty 30, 5d supply, fill #0

## 2024-10-18 MED ORDER — FENTANYL CITRATE (PF) 100 MCG/2ML IJ SOLN: 25.0000 ug | INTRAMUSCULAR | Status: DC | PRN

## 2024-10-18 MED ORDER — HYDROCODONE-ACETAMINOPHEN 5-325 MG PO TABS
1.0000 | ORAL_TABLET | ORAL | Status: DC | PRN
  Administered 2024-10-18 (×2): 1 via ORAL
  Filled 2024-10-18 (×2): qty 1

## 2024-10-18 MED ORDER — TRAMADOL HCL 50 MG PO TABS
50.0000 mg | ORAL_TABLET | Freq: Four times a day (QID) | ORAL | Status: DC | PRN
  Administered 2024-10-18: 50 mg via ORAL
  Filled 2024-10-18: qty 1

## 2024-10-18 MED ORDER — MUPIROCIN 2 % EX OINT
1.0000 | TOPICAL_OINTMENT | Freq: Two times a day (BID) | CUTANEOUS | 0 refills | Status: AC
  Filled 2024-10-18: qty 60, 30d supply, fill #0

## 2024-10-18 MED ORDER — KETOROLAC TROMETHAMINE 15 MG/ML IJ SOLN
7.5000 mg | Freq: Four times a day (QID) | INTRAMUSCULAR | Status: AC
  Administered 2024-10-18 – 2024-10-19 (×4): 7.5 mg via INTRAVENOUS
  Filled 2024-10-18 (×4): qty 1

## 2024-10-18 MED ORDER — HYDROXYCHLOROQUINE SULFATE 200 MG PO TABS
200.0000 mg | ORAL_TABLET | Freq: Two times a day (BID) | ORAL | Status: DC
  Administered 2024-10-18 – 2024-10-19 (×2): 200 mg via ORAL
  Filled 2024-10-18 (×2): qty 1

## 2024-10-18 MED ORDER — MIDAZOLAM HCL 5 MG/5ML IJ SOLN
INTRAMUSCULAR | Status: DC | PRN
  Administered 2024-10-18: 2 mg via INTRAVENOUS

## 2024-10-18 MED ORDER — TRAMADOL HCL 50 MG PO TABS
50.0000 mg | ORAL_TABLET | Freq: Four times a day (QID) | ORAL | 0 refills | Status: AC | PRN
  Filled 2024-10-18: qty 30, 8d supply, fill #0

## 2024-10-18 MED ORDER — CHLORHEXIDINE GLUCONATE 0.12 % MT SOLN
OROMUCOSAL | Status: AC
  Filled 2024-10-18: qty 15

## 2024-10-18 MED ORDER — ACETAMINOPHEN 500 MG PO TABS
1000.0000 mg | ORAL_TABLET | Freq: Three times a day (TID) | ORAL | Status: DC
  Administered 2024-10-18 – 2024-10-19 (×3): 1000 mg via ORAL
  Filled 2024-10-18 (×3): qty 2

## 2024-10-18 MED ORDER — SURGIPHOR WOUND IRRIGATION SYSTEM - OPTIME: TOPICAL | Status: DC | PRN

## 2024-10-18 MED ORDER — PROPOFOL 10 MG/ML IV BOLUS
INTRAVENOUS | Status: AC
  Filled 2024-10-18: qty 20

## 2024-10-18 MED ORDER — MESALAMINE 1.2 G PO TBEC
1.2000 g | DELAYED_RELEASE_TABLET | Freq: Three times a day (TID) | ORAL | Status: DC
  Administered 2024-10-18: 1.2 g via ORAL
  Filled 2024-10-18 (×4): qty 1

## 2024-10-18 MED ORDER — DOCUSATE SODIUM 100 MG PO CAPS
100.0000 mg | ORAL_CAPSULE | Freq: Two times a day (BID) | ORAL | 0 refills | Status: AC
  Filled 2024-10-18: qty 10, 5d supply, fill #0

## 2024-10-18 MED ORDER — ONDANSETRON HCL 4 MG PO TABS: 4.0000 mg | ORAL_TABLET | Freq: Four times a day (QID) | ORAL | Status: DC | PRN

## 2024-10-18 MED ORDER — ENOXAPARIN SODIUM 40 MG/0.4ML IJ SOSY
40.0000 mg | PREFILLED_SYRINGE | INTRAMUSCULAR | 0 refills | Status: AC
  Filled 2024-10-18: qty 5.6, 14d supply, fill #0

## 2024-10-18 MED ORDER — 0.9 % SODIUM CHLORIDE (POUR BTL) OPTIME
TOPICAL | Status: DC | PRN
  Administered 2024-10-18: 500 mL

## 2024-10-18 MED ADMIN — Phenylephrine-NaCl IV Solution 20 MG/250ML-0.9%: 40 ug/min | INTRAVENOUS | NDC 99999070041

## 2024-10-18 SURGICAL SUPPLY — 57 items
BLADE CLIPPER SURG (BLADE) IMPLANT
BLADE SAGITTAL AGGR TOOTH XLG (BLADE) ×1 IMPLANT
BNDG COHESIVE 4X5 TAN STRL (GAUZE/BANDAGES/DRESSINGS) ×1 IMPLANT
BNDG COHESIVE 6X5 TAN ST LF (GAUZE/BANDAGES/DRESSINGS) ×2 IMPLANT
BRUSH SCRUB EZ PLAIN DRY (MISCELLANEOUS) ×1 IMPLANT
CHLORAPREP W/TINT 26 (MISCELLANEOUS) ×1 IMPLANT
DERMABOND ADVANCED .7 DNX12 (GAUZE/BANDAGES/DRESSINGS) ×1 IMPLANT
DRAPE C-ARM XRAY 36X54 (DRAPES) ×1 IMPLANT
DRAPE SHEET LG 3/4 BI-LAMINATE (DRAPES) ×2 IMPLANT
DRAPE TABLE BACK 80X90 (DRAPES) ×1 IMPLANT
DRSG MEPILEX SACRM 8.7X9.8 (GAUZE/BANDAGES/DRESSINGS) ×1 IMPLANT
DRSG OPSITE POSTOP 4X8 (GAUZE/BANDAGES/DRESSINGS) ×1 IMPLANT
ELECTRODE BLDE 4.0 EZ CLN MEGD (MISCELLANEOUS) ×1 IMPLANT
ELECTRODE REM PT RTRN 9FT ADLT (ELECTROSURGICAL) ×1 IMPLANT
GLOVE BIO SURGEON STRL SZ8 (GLOVE) ×1 IMPLANT
GLOVE BIOGEL PI IND STRL 8 (GLOVE) ×1 IMPLANT
GLOVE PI ORTHO PRO STRL 7.5 (GLOVE) ×2 IMPLANT
GLOVE PI ORTHO PRO STRL SZ8 (GLOVE) ×2 IMPLANT
GLOVE SURG SYN 7.5 PF PI (GLOVE) ×1 IMPLANT
GOWN SRG XL LONG LVL 3 NONREIN (GOWNS) ×1 IMPLANT
GOWN SRG XL LVL 3 NONREINFORCE (GOWNS) ×1 IMPLANT
GOWN STRL REUS W/ TWL LRG LVL3 (GOWN DISPOSABLE) ×1 IMPLANT
HEAD CERAMIC FEMORAL 36MM (Head) IMPLANT
HOOD PEEL AWAY T7 (MISCELLANEOUS) ×2 IMPLANT
INSERT TRIDENT POLY 36 0DEG (Insert) IMPLANT
IV SODIUM CHLORIDE 100 ML PAB (IV SOLUTION) ×1 IMPLANT
KIT PATIENT CARE HANA TABLE (KITS) ×1 IMPLANT
KIT TURNOVER CYSTO (KITS) ×1 IMPLANT
LIGHT WAVEGUIDE WIDE FLAT (MISCELLANEOUS) ×1 IMPLANT
MANIFOLD NEPTUNE II (INSTRUMENTS) ×1 IMPLANT
MARKER SKIN DUAL TIP RULER LAB (MISCELLANEOUS) ×1 IMPLANT
MAT ABSORB FLUID 56X50 GRAY (MISCELLANEOUS) ×1 IMPLANT
NDL SPNL 20GX3.5 QUINCKE YW (NEEDLE) ×1 IMPLANT
NEEDLE SPNL 20GX3.5 QUINCKE YW (NEEDLE) ×1 IMPLANT
NS IRRIG 500ML POUR BTL (IV SOLUTION) ×1 IMPLANT
PACK HIP COMPR (MISCELLANEOUS) ×1 IMPLANT
PAD ARMBOARD POSITIONER FOAM (MISCELLANEOUS) ×1 IMPLANT
PENCIL SMOKE EVACUATOR (MISCELLANEOUS) ×1 IMPLANT
SCREW HEX LP 6.5X20 (Screw) IMPLANT
SCREW HEX LP 6.5X30 (Screw) IMPLANT
SHELL CLUSTERHOLE ACETABULAR 5 (Shell) IMPLANT
SLEEVE SCD COMPRESS KNEE MED (STOCKING) ×1 IMPLANT
SOLN STERILE WATER BTL 1000 ML (IV SOLUTION) ×1 IMPLANT
SOLUTION IRRIG SURGIPHOR (IV SOLUTION) ×1 IMPLANT
STEM STD OFFSET SZ5 36 (Stem) IMPLANT
SURGIFLO W/THROMBIN 8M KIT (HEMOSTASIS) IMPLANT
SUT BONE WAX W31G (SUTURE) ×1 IMPLANT
SUT ETHIBOND 2 V 37 (SUTURE) ×1 IMPLANT
SUT SILK 0 30XBRD TIE 6 (SUTURE) ×1 IMPLANT
SUT STRATAFIX 14 PDO 36 VLT (SUTURE) ×1 IMPLANT
SUT VIC AB 0 CT1 36 (SUTURE) ×1 IMPLANT
SUT VIC AB 2-0 CT2 27 (SUTURE) ×1 IMPLANT
SUTURE STRATA SPIR 4-0 18 (SUTURE) ×1 IMPLANT
SYR 20ML LL LF (SYRINGE) ×2 IMPLANT
TAPE MICROFOAM 4IN (TAPE) IMPLANT
TRAP FLUID SMOKE EVACUATOR (MISCELLANEOUS) ×1 IMPLANT
WAND WEREWOLF FASTSEAL 6.0 (MISCELLANEOUS) ×1 IMPLANT

## 2024-10-18 NOTE — Anesthesia Preprocedure Evaluation (Signed)
"                                    Anesthesia Evaluation  Patient identified by MRN, date of birth, ID band Patient awake    Reviewed: Allergy & Precautions, NPO status , Patient's Chart, lab work & pertinent test results  History of Anesthesia Complications Negative for: history of anesthetic complications  Airway Mallampati: IV   Neck ROM: Full    Dental  (+) Missing   Pulmonary former smoker Emphysema    Pulmonary exam normal breath sounds clear to auscultation       Cardiovascular hypertension, Normal cardiovascular exam Rhythm:Regular Rate:Normal  ECG 08/02/24: normal   Neuro/Psych negative neurological ROS     GI/Hepatic ,GERD  Controlled,,Ulcerative colitis   Endo/Other  Obesity; prediabetes  Renal/GU negative Renal ROS     Musculoskeletal  (+) Arthritis ,    Abdominal   Peds  Hematology negative hematology ROS (+)   Anesthesia Other Findings   Reproductive/Obstetrics                              Anesthesia Physical Anesthesia Plan  ASA: 2  Anesthesia Plan: General/Spinal   Post-op Pain Management: Regional block* and Ofirmev  IV (intra-op)*   Induction: Intravenous  PONV Risk Score and Plan: 2 and Ondansetron , Dexamethasone , Treatment may vary due to age or medical condition, Propofol  infusion and TIVA  Airway Management Planned: Natural Airway  Additional Equipment:   Intra-op Plan:   Post-operative Plan:   Informed Consent: I have reviewed the patients History and Physical, chart, labs and discussed the procedure including the risks, benefits and alternatives for the proposed anesthesia with the patient or authorized representative who has indicated his/her understanding and acceptance.     Dental advisory given  Plan Discussed with: CRNA  Anesthesia Plan Comments:          Anesthesia Quick Evaluation  "

## 2024-10-18 NOTE — Progress Notes (Signed)
 Patient has mobility impairment for daily activities. A rolling walker will resolve this and the patient is safe to use it    T. Medford Amber, PA-C [facility]

## 2024-10-18 NOTE — Plan of Care (Signed)
  Problem: Education: Goal: Understanding of discharge needs will improve Outcome: Progressing   Problem: Activity: Goal: Ability to avoid complications of mobility impairment will improve Outcome: Progressing Goal: Ability to tolerate increased activity will improve Outcome: Progressing   Problem: Clinical Measurements: Goal: Postoperative complications will be avoided or minimized Outcome: Progressing   Problem: Pain Management: Goal: Pain level will decrease with appropriate interventions Outcome: Progressing   Problem: Skin Integrity: Goal: Will show signs of wound healing Outcome: Progressing

## 2024-10-18 NOTE — Progress Notes (Signed)
 Patient is not able to walk the distance required to go the bathroom, or he is unable to safely negotiate stairs required to access the bathroom.  A 3in1 BSC will alleviate this problem.        Lollie Marrow, PA-C [facility]

## 2024-10-18 NOTE — TOC CM/SW Note (Signed)
 Transition of Care [facility]) - Inpatient Brief Assessment   Patient Details  Name: DALANTE MINUS MRN: [id number] Date of Birth: 01-14-1961  [facility] CM/SW Contact:    Nathanael CHRISTELLA Ring, RN Phone Number: 10/18/2024, 12:59 PM   Clinical Narrative: Patient is prearranged with Home health services with [facility] through Dr. Mal office.     Transition of Care Asessment: Insurance and Status: Insurance coverage has been reviewed Patient has primary care physician: Yes Home environment has been reviewed: From home with wife Prior level of function:: independent Prior/Current Home Services: No current home services Social Drivers of Health Review: SDOH reviewed no interventions necessary Readmission risk has been reviewed: No (Outpatient in bed) Transition of care needs: transition of care needs identified, TOC will continue to follow

## 2024-10-18 NOTE — Discharge Instructions (Signed)

## 2024-10-18 NOTE — H&P (Signed)
 History of Present Illness: Jason Dickson is an 63 y.o. male presents for history and physical for left anterior total hip arthroplasty with Dr. Lorelle on 10/18/2024. Patient has severe left hip degenerative arthritis. He has pain located in his groin and lateral hip thigh. His pain has been present for greater than a year and can reach 10 out of 10. He is unable to walk short distances such as 1 block due to pain in his groin and lateral hip. He has been taking anti-inflammatory medications with little relief. He has a problem with activities involving hip flexion as he is very stiff. He has seen Dr. Lorelle discussed total hip arthroplasty and agreed and consented the procedure.  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  Past Medical History: Past Medical History:  Diagnosis Date  Allergy 1961/02/06  Arthritis  Emphysema lung (CT 01/2022) 02/15/2022  Emphysema lung (CT 01/2022) 02/15/2022  Essential hypertension  GERD (gastroesophageal reflux disease)  Pure hypercholesterolemia  Ulcerative colitis (CMS/HHS-HCC)   Past Surgical History: Past Surgical History:  Procedure Laterality Date  ORIF METACARPAL FRACTURE Right 03/02/2013  Fifth metacarpal  Right shoulder repair 07/2013  ARTHROSCOPIC ROTATOR CUFF REPAIR Left 10/18/2022  Colon@[facility]  05/21/2024  Colitis/Rpt47mon/[facility]  EGD@[facility]  05/21/2024  NML/NoRpt/[facility]  UMBILICAL HERNIA REPAIR 08/06/2024  Dr Henriette Pierre  COLON @ [facility] 09/17/2024  Normal/ Repeat [place] years[place] [facility]  FRACTURE SURGERY ?  broken hand   Past Family History: Family History  Problem Relation Age of Onset  Cancer Mother  No Known Problems Brother   Medications: Current Outpatient Medications  Medication Sig Dispense Refill  ascorbic acid (VITAMIN C ORAL) Take 1 tablet by mouth once daily  celecoxib  (CELEBREX ) 100 MG capsule TAKE 1 CAPSULE BY MOUTH  2 TIMES DAILY FOR 30 DAYS. 60 capsule 0  cholecalciferol (VITAMIN D3) 5,000 unit capsule Take 5,000 Units by mouth once daily  cyanocobalamin (VITAMIN B12) 100 MCG tablet Take 1,000 mcg by mouth once daily  fluticasone  (FLONASE ) 50 mcg/actuation nasal spray Place 1 spray into both nostrils once daily as needed  Herbal Supplement Herbal Name: Total Beets-nitric oxide booster daily, PARASET  lisinopriL  (ZESTRIL ) 5 MG tablet Take 1 tablet (5 mg total) by mouth once daily 7 tablet 0  loratadine  (CLARITIN ) 10 mg tablet Take 10 mg by mouth once daily as needed.   lovastatin (MEVACOR) 40 MG tablet Take 1 tablet (40 mg total) by mouth at bedtime for 120 days 100 tablet 1  magnesium  250 mg Tab Take 500 mg by mouth once daily  mesalamine  (LIALDA ) 1.2 gram EC tablet Take 1 tablet (1.2 g total) by mouth 3 (three) times daily 90 tablet 3  MORINGA OLEIFERA ORAL Take by mouth once daily  niacin 500 MG tablet Take 500 mg by mouth at bedtime  omeprazole (PRILOSEC OTC) 20 MG tablet Take 20 mg by mouth once daily.  sildenafil (REVATIO) 20 mg tablet 2-3 tabs daily by mouth as needed 270 tablet 1  TURMERIC ORAL Take by mouth once daily   No current facility-administered medications for this visit.   Allergies: No Known Allergies   Visit Vitals: Vitals:  10/08/24 1447  BP: 134/62    Review of Systems:  A comprehensive 14 point ROS was performed, reviewed, and the pertinent orthopaedic findings are documented in the HPI.  Physical Exam: Body mass index is 28.95 kg/m. General:  Well developed, well nourished, no apparent distress, normal affect, normal gait with no antalgic component.   HEENT: Head normocephalic, atraumatic, PERRL.   Abdomen: Soft, non tender, non distended, Bowel sounds present.  Heart: Examination of the heart reveals regular, rate, and rhythm. There is no murmur noted on ascultation. There is a normal apical pulse.  Lungs: Lungs are clear to auscultation. There is no  wheeze, rhonchi, or crackles. There is normal expansion of bilateral chest walls.   Left hip exam  SKIN: intact SWELLING: none WARMTH: no warmth TENDERNESS: none, Stinchfield Positive ROM: 0 degrees internal rotation and 30 degrees external rotation and pain with internal rotation,; Hip Flexion 90 STRENGTH: limited by pain GAIT: antalgic STABILITY: stable to testing CREPITUS: no LEG LENGTH DISCREPANCY: none NEUROLOGICAL EXAM: normal VASCULAR EXAM: normal LUMBAR SPINE: tenderness: no straight leg raising sign: no motor exam: normal  The contralateral hip was examined for comparison and it showed: TENDERNESS: none ROM: normal and full STRENGTH: normal STABILITY: stable to testing  Hip Imaging :  I reviewed AP pelvis and bilateral hip lateral hip x-rays performed on 07/08/2024 images reviewed by myself. The patient has moderate to severe degenerative changes of both hips worse on the right with femoral head deformity bone-on-bone articulation of both hips osteophyte formation and sclerosis. There is also thickened degenerative changes of the lumbosacral spine with joint height loss osteophyte formation and sclerosis in the partial visualization. No fractures or dislocations noted.  Assessment:  Severe left hip osteoarthritis  Plan: Jason Dickson is a 63 year old male with severe left hip osteoarthritis. He has had greater than 1 year of increasing left hip pain with pain that is interfered with his quality of life and activities day living. No relief with conservative treatment. Risks, benefits, complications of a left total hip arthroplasty have been discussed with the patient. Patient has agreed and consented procedure with Dr. Lorelle on 10/18/2004.  The hospitalization and post-operative care and rehabilitation were also discussed. The use of perioperative antibiotics and DVT prophylaxis were discussed. The risk, benefits and alternatives to a surgical intervention were discussed at  length with the patient. The patient was also advised of risks related to the medical comorbidities and elevated body mass index (BMI). A lengthy discussion took place to review the most common complications including but not limited to: deep vein thrombosis, pulmonary embolus, heart attack, stroke, infection, wound breakdown, heterotopic ossification, dislocation, numbness, leg length in-equality, intraoperative fracture, damage to nerves, tendon,muscles, arteries or other blood vessels, death and other possible complications from anesthesia. The patient was told that we will take steps to minimize these risks by using sterile technique, antibiotics and DVT prophylaxis when appropriate and follow the patient postoperatively in the office setting to monitor progress. The possibility of recurrent pain, no improvement in pain and actual worsening of pain were also discussed with the patient. The risk of dislocation following total hip replacement was discussed and potential precautions to prevent dislocation were reviewed.    All questions answered patient agrees with plan for left anterior total hip arthroplasty.

## 2024-10-18 NOTE — TOC CM/SW Note (Signed)
 Per RN, patient only wants RW. He does not want a 3-in-1. CSW sent secure chat to MD and PA to request DME order. Ordered through [employer].

## 2024-10-18 NOTE — Anesthesia Procedure Notes (Signed)
 Spinal  Patient location during procedure: OR Start time: 10/18/2024 7:42 AM End time: 10/18/2024 7:46 AM Reason for block: surgical anesthesia  Staffing Performed: anesthesiologist  Authorized by: Vicci Camellia Glatter, MD   Performed by: Lorrene Camelia LABOR, CRNA  Preanesthetic Checklist Completed: patient identified, IV checked, site marked, risks and benefits discussed, surgical consent, monitors and equipment checked, pre-op evaluation and timeout performed Spinal Block Patient position: sitting Prep: ChloraPrep Patient monitoring: heart rate, continuous pulse ox, blood pressure and cardiac monitor Approach: midline Location: L4-5 Injection technique: single-shot Needle Needle type: Introducer and Pencan  Needle gauge: 24 G Needle length: 9 cm Assessment Events: CSF return  Additional Notes Negative paresthesia. Negative blood return. Positive free-flowing CSF. Expiration date of kit checked and confirmed. Patient tolerated procedure well, without complications.

## 2024-10-18 NOTE — Transfer of Care (Signed)
 Immediate Anesthesia Transfer of Care Note  Patient: Jason Dickson  Procedure(s) Performed: ARTHROPLASTY, HIP, TOTAL, ANTERIOR APPROACH (Left: Hip)  Patient Location: PACU  Anesthesia Type:Spinal  Level of Consciousness: awake and patient cooperative  Airway & Oxygen Therapy: Patient Spontanous Breathing  Post-op Assessment: Report given to RN and Post -op Vital signs reviewed and stable  Post vital signs: Reviewed and stable  Last Vitals:  Vitals Value Taken Time  BP 100/51 10/18/24 09:42  Temp 37.6 C 10/18/24 09:42  Pulse 68 10/18/24 09:42  Resp 15 10/18/24 09:42  SpO2 97 % 10/18/24 09:42  Vitals shown include unfiled device data.  Last Pain:  Vitals:   10/18/24 0942  TempSrc: Tympanic  PainSc:          Complications: No notable events documented.

## 2024-10-18 NOTE — Evaluation (Signed)
 Physical Therapy Evaluation Patient Details Name: Jason Dickson MRN: [id number] DOB: [birth year] Today's Date: 10/18/2024  History of Present Illness  Patient is a 63 year old male with  left hip osteoarthritis s/p left total hip arthroplasty.  Clinical Impression  Patient is agreeable to PT evaluation and requesting to get in the chair for comfort due to chronic back pain. He reports he works and is independent at baseline. He lives with his spouse in a single level home with 3 steps to enter.   Today the patient was able to stand twice for 4-5 minutes with each stand using rolling walker. Ambulation not attempted out of safety due to mild knee buckling in the left leg with weight bearing. Anticipate progression of independence and activity tolerance as anesthesia resolved completely. Back pain improved sitting up in chair. PT will continue to follow to maximize independence and decrease caregiver burden.       If plan is discharge home, recommend the following: Assist for transportation   Can travel by private vehicle        Equipment Recommendations Rolling walker (2 wheels);BSC/3in1  Recommendations for Other Services       Functional Status Assessment Patient has had a recent decline in their functional status and demonstrates the ability to make significant improvements in function in a reasonable and predictable amount of time.     Precautions / Restrictions Precautions Precautions: Fall;Anterior Hip Precaution Booklet Issued: Yes (comment) Restrictions Weight Bearing Restrictions Per Provider Order: Yes LLE Weight Bearing Per Provider Order: Weight bearing as tolerated      Mobility  Bed Mobility Overal bed mobility: Modified Independent                  Transfers Overall transfer level: Needs assistance Equipment used: Rolling walker (2 wheels) Transfers: Sit to/from Stand, Bed to chair/wheelchair/BSC Sit to Stand: Min assist, Contact guard assist    Step pivot transfers: Min assist       General transfer comment: patient is asking to get up to the chair for comfort secondary to chronic back pain. Min A for first stand, CGA for second stand. pivot transfer from bed to recliner chair    Ambulation/Gait             Pre-gait activities: 2 standing bouts of ~ 4-5 minutes with each stand. RW used for UE support General Gait Details: not assessed as patient had mild knee buckling with weight bearing through LLE. anticipate progression of walking with rolling walker when anesthesia fully resolved  Stairs            Wheelchair Mobility     Tilt Bed    Modified Rankin (Stroke Patients Only)       Balance Overall balance assessment: Needs assistance Sitting-balance support: Feet supported Sitting balance-Leahy Scale: Good     Standing balance support: Bilateral upper extremity supported Standing balance-Leahy Scale: Poor Standing balance comment: relying on rolling walker for support in standing. recommended to use rolling walker for safety and fall prevention                             Pertinent Vitals/Pain Pain Assessment Pain Assessment: Faces Faces Pain Scale: Hurts little more Pain Location: mid to low back pain (chronic) Pain Descriptors / Indicators: Discomfort Pain Intervention(s): Limited activity within patient's tolerance, Monitored during session, Repositioned    Home Living Family/patient expects to be discharged to:: Private residence Living Arrangements: Spouse/significant  other Available Help at Discharge: Family;Available 24 hours/day Type of Home: House Home Access: Stairs to enter Entrance Stairs-Rails: Left Entrance Stairs-Number of Steps: 3   Home Layout: One level Home Equipment: None      Prior Function Prior Level of Function : Independent/Modified Independent;Working/employed                     Extremity/Trunk Assessment   Upper Extremity  Assessment Upper Extremity Assessment: Overall WFL for tasks assessed    Lower Extremity Assessment Lower Extremity Assessment: LLE deficits/detail;RLE deficits/detail RLE Deficits / Details: can perform SLR from bed and activate hip/knee/ankle movement RLE Sensation:  (pins and needles sensation to bottom of the foot that improved during session) LLE Deficits / Details: can perform SLR from bed and activate hip/knee/ankle movement LLE Sensation:  (pins and needles sensation to bottom of the foot that improved during session)       Communication   Communication Communication: No apparent difficulties    Cognition Arousal: Alert Behavior During Therapy: WFL for tasks assessed/performed   PT - Cognitive impairments: No apparent impairments                         Following commands: Intact       Cueing Cueing Techniques: Verbal cues     General Comments General comments (skin integrity, edema, etc.): supportive family at the bedside    Exercises     Assessment/Plan    PT Assessment Patient needs continued PT services  PT Problem List Decreased strength;Decreased range of motion;Decreased activity tolerance;Decreased balance;Decreased mobility;Decreased knowledge of use of DME       PT Treatment Interventions DME instruction;Gait training;Stair training;Functional mobility training;Therapeutic activities;Therapeutic exercise;Balance training;Patient/family education    PT Goals (Current goals can be found in the Care Plan section)  Acute Rehab PT Goals Patient Stated Goal: to return to work PT Goal Formulation: With patient Time For Goal Achievement: 11/01/24 Potential to Achieve Goals: Fair    Frequency BID     Co-evaluation               AM-PAC PT 6 Clicks Mobility  Outcome Measure Help needed turning from your back to your side while in a flat bed without using bedrails?: None Help needed moving from lying on your back to sitting on the  side of a flat bed without using bedrails?: A Little Help needed moving to and from a bed to a chair (including a wheelchair)?: A Little Help needed standing up from a chair using your arms (e.g., wheelchair or bedside chair)?: A Little Help needed to walk in hospital room?: A Little Help needed climbing 3-5 steps with a railing? : A Little 6 Click Score: 19    End of Session Equipment Utilized During Treatment: Gait belt Activity Tolerance: Patient tolerated treatment well Patient left: with call bell/phone within reach;in chair;with nursing/sitter in room Nurse Communication: Mobility status (mild knee buckling with weight bearing) PT Visit Diagnosis: Difficulty in walking, not elsewhere classified (R26.2);Other abnormalities of gait and mobility (R26.89)    Time: 8651-8578 PT Time Calculation (min) (ACUTE ONLY): 33 min   Charges:   PT Evaluation $PT Eval Low Complexity: 1 Low PT Treatments $Therapeutic Activity: 8-22 mins PT General Charges $$ ACUTE PT VISIT: 1 Visit         Randine Essex, PT, MPT   Randine LULLA Essex 10/18/2024, 2:35 PM

## 2024-10-18 NOTE — Anesthesia Procedure Notes (Signed)
 Procedure Name: MAC Date/Time: 10/18/2024 7:47 AM  Performed by: Lorrene Camelia LABOR, CRNAPre-anesthesia Checklist: Patient identified, Emergency Drugs available, Suction available and Patient being monitored Patient Re-evaluated:Patient Re-evaluated prior to induction Oxygen Delivery Method: Simple face mask Preoxygenation: Pre-oxygenation with 100% oxygen Induction Type: IV induction

## 2024-10-18 NOTE — Discharge Summary (Incomplete)
 Physician Discharge Summary  Patient ID: Jason Dickson MRN: [id number] DOB/AGE: 62-Jan-1962 41 y.o.  Admit date: 10/18/2024 Discharge date: 10/19/2024  Admission Diagnoses:  Primary osteoarthritis of left hip [M16.12] S/P total left hip arthroplasty [S03.357]   Discharge Diagnoses: Patient Active Problem List   Diagnosis Date Noted   S/P total left hip arthroplasty 10/18/2024   Pure hypercholesterolemia 12/28/2020   History of 2019 novel coronavirus disease (COVID-19) 08/04/2020   Chronic lumbar radiculopathy 07/31/2018   Benign prostatic hyperplasia with weak urinary stream 10/15/2016   Essential hypertension 10/15/2016   Other male erectile dysfunction 10/15/2016   Borderline diabetes mellitus 04/20/2015   Mild obesity 10/19/2014    Past Medical History:  Diagnosis Date   Allergy    Information from [facility]   Aortic atherosclerosis    Arthritis    Information from [facility]   Cholelithiasis    Emphysema lung (HCC)    GERD (gastroesophageal reflux disease)    Information from [facility]   Hypercholesterolemia    Information from [facility]   Hypertension    Information from [facility]   Non-recurrent unilateral inguinal hernia without obstruction or gangrene    Pneumonia    Pre-diabetes    Primary osteoarthritis of left hip      Transfusion: none   Consultants (if any):   Discharged Condition: Improved  Hospital Course: Jason Dickson is an 63 y.o. male who was admitted 10/18/2024 with a diagnosis of S/P total left hip arthroplasty and went to the operating room on 10/18/2024 and underwent the above named procedures.    Surgeries: Procedures: ARTHROPLASTY, HIP, TOTAL, ANTERIOR APPROACH on 10/18/2024 Patient tolerated the surgery well. Taken to PACU where she was stabilized and then transferred to the orthopedic floor.  Started on Lovenox  40 mg q 24 hrs.  TEDs and SCDs applied bilaterally. Heels elevated on bed. No evidence of DVT. Negative [place]oman. Physical therapy started on day #1 for gait training and transfer. OT started day #1 for ADL and assisted devices.  Patient's IV was d/c on day #1. Patient was able to safely and independently complete all PT goals. PT recommending discharge to home.    On post op day #1 patient was stable and ready for discharge to home with HHPT.  Implants: Cup: Trident Tritanium clusterhole 52/E w/x2 screws    Liner: Neutral X3 poly 36/E  Stem: Insignia #5 Std Offset  Head:Biolox Ceramic 36 +0   He was given perioperative antibiotics:  Anti-infectives (From admission, onward)    Start     Dose/Rate Route Frequency Ordered Stop   10/18/24 2200  hydroxychloroquine  (PLAQUENIL ) tablet 200 mg        200 mg Oral 2 times daily 10/18/24 1112     10/18/24 1400  ceFAZolin  (ANCEF ) IVPB 2g/100 mL premix        2 g 200 mL/hr over 30 Minutes Intravenous Every 6 hours 10/18/24 1112 10/19/24 0159   10/18/24 0600  ceFAZolin  (ANCEF ) IVPB 2g/100 mL premix        2 g 200 mL/hr over 30 Minutes Intravenous On call to O.R. 10/17/24 2224 10/18/24 0744     .  He was given sequential compression devices, early ambulation, and loveox TEDs for DVT prophylaxis.  He benefited maximally from the hospital stay and there were no complications.    Recent vital signs:  Vitals:   10/18/24 1045 10/18/24 1952  BP: (!) 116/50 134/70  Pulse: 65 97  Resp: (!) 24 18  Temp:  (!) 97.2 F (36.2 C)  SpO2: 100% 99%    Recent laboratory studies:  Lab Results  Component Value Date   HGB 12.7 (L) 08/02/2024   HGB 13.4 12/28/2020   HGB 14.4 08/12/2014   Lab Results  Component Value Date   WBC 5.7 08/02/2024   PLT 267 08/02/2024   No results found for: INR Lab Results  Component Value Date   NA 138 08/02/2024   K 4.3 08/02/2024   CL 103 08/02/2024   CO2 26 08/02/2024   BUN 23 08/02/2024   CREATININE 1.21 08/02/2024   GLUCOSE  86 08/02/2024    Discharge Medications:   Allergies as of 10/18/2024   No Known Allergies      Medication List     STOP taking these medications    ibuprofen  800 MG tablet Commonly known as: ADVIL        TAKE these medications    acetaminophen  500 MG tablet Commonly known as: TYLENOL  Take 2 tablets (1,000 mg total) by mouth every 8 (eight) hours. What changed:  when to take this reasons to take this   celecoxib  200 MG capsule Commonly known as: CeleBREX  Take 1 capsule (200 mg total) by mouth 2 (two) times daily for 14 days. What changed:  medication strength how much to take   docusate sodium  100 MG capsule Commonly known as: COLACE Take 1 capsule (100 mg total) by mouth 2 (two) times daily.   enoxaparin  40 MG/0.4ML injection Commonly known as: LOVENOX  Inject 0.4 mLs (40 mg total) into the skin daily. Start taking on: October 19, 2024   fluticasone  50 MCG/ACT nasal spray Commonly known as: FLONASE  Place 2 sprays into both nostrils daily as needed for allergies or rhinitis.   hydroxychloroquine  200 MG tablet Commonly known as: PLAQUENIL  Take 200 mg by mouth 2 (two) times daily.   ipratropium 0.06 % nasal spray Commonly known as: ATROVENT  Place 2 sprays into both nostrils 4 (four) times daily. What changed:  when to take this reasons to take this   lisinopril  5 MG tablet Commonly known as: ZESTRIL  Take 5 mg by mouth daily.   loratadine  10 MG tablet Commonly known as: CLARITIN  Take 10 mg by mouth daily.   Magnesium  250 MG Tabs Take 500 mg by mouth daily.   mesalamine  1.2 g EC tablet Commonly known as: LIALDA  Take 1.2 g by mouth in the morning, at noon, and at bedtime.   metronidazole 375 MG capsule Commonly known as: FLAGYL Take 375 mg by mouth 2 (two) times daily.   MORINGA PO Take 1 capsule by mouth daily.   mupirocin  ointment 2 % Commonly known as: BACTROBAN  Place 1 Application into the nose 2 (two) times daily for 60 doses. Use as  directed 2 times daily for 5 days every other week for 6 weeks.   niacin 500 MG tablet Commonly known as: VITAMIN B3 Take 500 mg by mouth daily.   omeprazole 20 MG capsule Commonly known as: PRILOSEC Take 20 mg by mouth daily.   ondansetron  4 MG tablet Commonly known as: ZOFRAN  Take 1 tablet (4 mg total) by mouth every 6 (six) hours as needed for nausea.   OVER THE COUNTER MEDICATION Take 1 Piece by mouth daily. Nitric Oxide Chews   oxyCODONE  5 MG immediate release tablet Commonly known as: Roxicodone  Take 0.5-1 tablets (2.5-5 mg total) by mouth every 8 (eight) hours as needed for breakthrough pain.  Potassium 99 MG Tabs Take 99 mg by mouth daily.   Saw Palmetto 160 MG Caps Take 160 mg by mouth daily.   traMADol  50 MG tablet Commonly known as: ULTRAM  Take 1 tablet (50 mg total) by mouth every 6 (six) hours as needed for moderate pain (pain score 4-6). What changed:  when to take this reasons to take this   vitamin B-12 100 MCG tablet Commonly known as: CYANOCOBALAMIN Take 100 mcg by mouth daily.   vitamin C 1000 MG tablet Take 1,000 mg by mouth daily.   Vitamin D 125 MCG (5000 UT) Caps Take 5,000 Units by mouth daily.               Durable Medical Equipment  (From admission, onward)           Start     Ordered   10/18/24 2013  For home use only DME Walker rolling  Once       Question Answer Comment  Walker: With 5 Inch Wheels   Patient needs a walker to treat with the following condition H/O total hip arthroplasty, left      10/18/24 2012   10/18/24 2012  For home use only DME 3 n 1  Once        10/18/24 2012            Diagnostic Studies: DG HIP UNILAT WITH PELVIS 2-3 VIEWS LEFT Result Date: 10/18/2024 CLINICAL DATA:  Elective surgery. EXAM: DG HIP (WITH OR WITHOUT PELVIS) 2-3V*L* COMPARISON:  None Available. FINDINGS: Three fluoroscopic spot views of the pelvis and left hip obtained in the operating room. Images during hip arthroplasty.  Fluoroscopy time 20 seconds. Dose 1.269 mGy. IMPRESSION: Intraoperative fluoroscopy during left hip arthroplasty. Electronically Signed   By: Andrea Gasman M.D.   On: 10/18/2024 12:45   DG C-Arm 1-60 Min-No Report Result Date: 10/18/2024 Fluoroscopy was utilized by the requesting physician.  No radiographic interpretation.    Disposition:      Follow-up Information     Charlene Debby BROCKS, PA-C Follow up in 2 week(s).   Specialties: Orthopedic Surgery, Emergency Medicine Contact information: [address] [place] [address] [phone]                  Signed: Debby BROCKS Charlene 10/18/2024, 8:18 PM

## 2024-10-18 NOTE — Interval H&P Note (Signed)
Patient history and physical updated. Consent reviewed including risks, benefits, and alternatives to surgery. Patient agrees with above plan to proceed with left anterior total hip arthroplasty  

## 2024-10-18 NOTE — TOC CM/SW Note (Deleted)
 Patient is not able to walk the distance required to go the bathroom, or he/she is unable to safely negotiate stairs required to access the bathroom.  A 3in1 BSC will alleviate this problem

## 2024-10-18 NOTE — Op Note (Signed)
 Patient Name: Jason Dickson  MRN: [id number]  Pre-Operative Diagnosis: Left hip Osteoarthritis  Post-Operative Diagnosis: (same)  Procedure: Left Total Hip Arthroplasty  Components/Implants: Cup: Trident Tritanium clusterhole 52/E w/x2 screws    Liner: Neutral X3 poly 36/E  Stem: Insignia #5 Std Offset  Head:Biolox Ceramic 36 +0  Date of Surgery: 10/18/2024  Surgeon: Arthea Sheer MD  Assistant: Debby Amber PA (present and scrubbed throughout the case, critical for assistance with exposure, retraction, instrumentation, and closure)   Anesthesiologist: Vicci  Anesthesia: Spinal  EBL: 150cc  IVF:650cc  Complications: None   Brief history: The patient is a 63 year old male with a history of osteoarthritis of the left hip with pain limiting their range of motion and activities of daily living, which has failed multiple attempts at conservative therapy.  The risks and benefits of total hip arthroplasty as definitive surgical treatment were discussed with the patient, who opted to proceed with the operation.  After outpatient medical clearance and optimization was completed the patient was admitted to [facility] for the procedure.  All preoperative films were reviewed and an appropriate surgical plan was made prior to surgery.   Description of procedure: The patient was brought to the operating room where laterality was confirmed by all those present to be the left side.  The patient was administered spinal anesthesia on a stretcher prior to being moved supine on the operating room table. Patient was given an intravenous dose of antibiotics for surgical prophylaxis and TXA.  All bony prominences and extremities were well padded and the patient was securely attached to the table boots, a perineal post was placed and the patient had a safety strap placed.  Surgical site was prepped with alcohol and chlorhexidine . The surgical site over the hip was and draped in  typical sterile fashion with multiple layers of adhesive and nonadhesive drapes.  The incision site was marked out with a sterile marker and care was taken to assess the position of the ASIS and ensure appropriate position for the incision.    A surgical timeout was then called with participation of all staff in the room the patient was then a confirmed again and laterality confirmed.  Incision was made over the anterior lateral aspect of the proximal thigh in line with the TFL.  Appropriate retractors were placed and all bleeding vessels were coagulated within the subcutaneous and fatty layers.  An incision was made in the TFL fascia in the interval was carefully identified.  The lateral ascending branches of the circumflex vessels were identified, cauterized and carefully dissected. The main vessels were then tied with a 0 silk hand tie.  Retractors were placed around the superior lateral and inferior medial aspects of the femoral neck and a capsulotomy was performed exposing the hip joint.  Retraction stitches were placed and the capsulotomy to assist with visualization.  The femoral head and anterior acetabulum were examined at this time and shown to have full thickness cartilage damage with severe arthritic changes, the decision was made to proceed with total hip arthroplasty. Femoral neck cut was then made and the femoral head was extracted after placing the leg in traction.  Bone wax was then applied to the proximal cut surface of the femur and water cooled bipolar electrocautery was used to address any bleeding around the femoral neck cut.  Retractors were then placed around the acetabulum to fully visualize the joint space, and the remaining labral tissue was removed and pulvinar was removed.   The  acetabulum was then sequentially reamed up to the appropriate size in order to get good fit and fill for the acetabular component while under fluoroscopic guidance.  Acetabular component was then placed and  malleted into a secure fit while confirming position and abduction angle and anteversion utilizing fluoroscopy.  2 screws were then placed in the acetabular cup to assist in securing the cup in place. The cup was irrigated,  a real neutral liner was placed, impacted, and checked for stability. The femur traction was dropped and sequentially externally rotated while performing a release of the posterior and superomedial tissues off of the proximal femur to allow for mobility, care was taken to preserve the external rotators and piriformis attachments.  The remaining interval between the abductors and the capsule was dissected out and a retractor was placed over the superolateral aspect of the femur over the greater trochanter.  The leg was carefully brought down into extension and adducted to provide visualization of the proximal femur for broaching.  The femur was then sequentially broached up to an appropriate size which provided for good fill and stability to the femoral broach.  A trial neck and head were placed on the femoral broach and the leg was brought up for reduction.  The hip was reduced and manual check of stability was performed.  The hip was found to be stable in flexion internal rotation and extension external rotation.  Leg lengths were confirmed on fluoroscopy.   The hip was then dislocated the trial neck and head were removed.  The leg was then brought down into extension and adduction in the proximal femur was reexposed.  The broach trial was removed and the femur was irrigated with normal saline prior to the real femoral stem being implanted.  After the femoral stem was seated and shown to have good fit and fill the appropriate head was impacted the leg was brought up and reduced.  There was good range of motion with stability in flexion internal rotation and extension external rotation on testing.  Leg lengths were found to be appropriate on fluoroscopic evaluation at this time.  The hip was  then irrigated with betdine based surgiphor solution and then saline solution.  The capsulotomy was repaired with Ethibond sutures.  A pericapsular and peritrochanteric cocktail with Exparel  and bupivacaine  was then injected as well as the subcutaneous tissues. The fascia was closed with a #1 barbed running suture.  The deep tissues were closed with Vicryl sutures the subcutaneous tissues were closed with interrupted Vicryl sutures and a running barbed 4-0 suture.  The skin was then reinforced with Dermabond and a sterile dressing was placed.   The patient was awoken from anesthesia transferred off of the operating room table onto a hospital bed where examination of leg lengths found the leg lengths to be equal with a good distal pulse.  The patient was then transferred to the PACU in stable condition.

## 2024-10-19 ENCOUNTER — Encounter: Payer: Self-pay | Admitting: Orthopedic Surgery

## 2024-10-19 ENCOUNTER — Other Ambulatory Visit: Payer: Self-pay

## 2024-10-19 DIAGNOSIS — M1612 Unilateral primary osteoarthritis, left hip: Secondary | ICD-10-CM | POA: Diagnosis not present

## 2024-10-19 LAB — BASIC METABOLIC PANEL WITH GFR
Anion gap: 10 (ref 5–15)
BUN: 18 mg/dL (ref 8–23)
CO2: 25 mmol/L (ref 22–32)
Calcium: 9.3 mg/dL (ref 8.9–10.3)
Chloride: 101 mmol/L (ref 98–111)
Creatinine, Ser: 0.88 mg/dL (ref 0.61–1.24)
GFR, Estimated: >60 mL/min
Glucose, Bld: 119 mg/dL — ABNORMAL HIGH (ref 70–99)
Potassium: 4.5 mmol/L (ref 3.5–5.1)
Sodium: 136 mmol/L (ref 135–145)

## 2024-10-19 LAB — CBC
HCT: 38.4 % — ABNORMAL LOW (ref 39.0–52.0)
Hemoglobin: 12.8 g/dL — ABNORMAL LOW (ref 13.0–17.0)
MCH: 31.8 pg (ref 26.0–34.0)
MCHC: 33.3 g/dL (ref 30.0–36.0)
MCV: 95.3 fL (ref 80.0–100.0)
Platelets: 301 K/uL (ref 150–400)
RBC: 4.03 MIL/uL — ABNORMAL LOW (ref 4.22–5.81)
RDW: 11.9 % (ref 11.5–15.5)
WBC: 14.5 K/uL — ABNORMAL HIGH (ref 4.0–10.5)
nRBC: 0 % (ref 0.0–0.2)

## 2024-10-19 MED ORDER — AMLODIPINE BESYLATE 5 MG PO TABS
ORAL_TABLET | ORAL | Status: AC
  Filled 2024-10-19: qty 1

## 2024-10-19 MED ORDER — CHLORHEXIDINE GLUCONATE CLOTH 2 % EX PADS
6.0000 | MEDICATED_PAD | Freq: Every day | CUTANEOUS | 0 refills | Status: AC
  Filled 2024-10-19: qty 30, 5d supply, fill #0

## 2024-10-19 MED ORDER — LORATADINE 10 MG PO TABS
ORAL_TABLET | ORAL | Status: AC
  Filled 2024-10-19: qty 1

## 2024-10-19 NOTE — Progress Notes (Signed)
" ° °  Subjective: 1 Day Post-Op Procedures (LRB): ARTHROPLASTY, HIP, TOTAL, ANTERIOR APPROACH (Left) Patient reports pain as mild.   Patient is well, and has had no acute complaints or problems Denies any CP, SOB, ABD pain. We will continue therapy today.  Plan is to go Home after hospital stay.  Objective: Vital signs in last 24 hours: Temp:  [97.2 F (36.2 C)-99.7 F (37.6 C)] 98.1 F (36.7 C) (12/23 0746) Pulse Rate:  [59-97] 71 (12/23 0746) Resp:  [11-24] 18 (12/23 0746) BP: (97-145)/(44-70) 145/56 (12/23 0746) SpO2:  [97 %-100 %] 98 % (12/23 0746)  Intake/Output from previous day: 12/22 0701 - 12/23 0700 In: 2520.3 [P.O.:240; I.V.:1780.3; IV Piggyback:500] Out: 900 [Urine:750; Blood:150] Intake/Output this shift: No intake/output data recorded.  Recent Labs    10/19/24 0646  HGB 12.8*   Recent Labs    10/19/24 0646  WBC 14.5*  RBC 4.03*  HCT 38.4*  PLT 301   Recent Labs    10/19/24 0646  NA 136  K 4.5  CL 101  CO2 25  BUN 18  CREATININE 0.88  GLUCOSE 119*  CALCIUM 9.3   No results for input(s): LABPT, INR in the last 72 hours.  EXAM General - Patient is Alert and Appropriate Extremity - Neurovascular intact Sensation intact distally Intact pulses distally Dorsiflexion/Plantar flexion intact Dressing - dressing C/D/I and no drainage Motor Function - intact, moving foot and toes well on exam.   Past Medical History:  Diagnosis Date   Allergy    Information from [facility]   Aortic atherosclerosis    Arthritis    Information from [facility]   Cholelithiasis    Emphysema lung (HCC)    GERD (gastroesophageal reflux disease)    Information from [facility]   Hypercholesterolemia    Information from [facility]   Hypertension    Information from [facility]   Non-recurrent unilateral inguinal hernia without obstruction or gangrene    Pneumonia     Pre-diabetes    Primary osteoarthritis of left hip     Assessment/Plan:   1 Day Post-Op Procedures (LRB): ARTHROPLASTY, HIP, TOTAL, ANTERIOR APPROACH (Left) Principal Problem:   S/P total left hip arthroplasty  Estimated body mass index is 29.01 kg/m as calculated from the following:   Height as of this encounter: 5' 11 (1.803 m).   Weight as of this encounter: 94.3 kg. Advance diet Up with therapy Pain well controlled Labs and VSS CM to assist with discharge to home with HHPT today   DVT Prophylaxis - Lovenox , TED hose, and SCDs Weight-Bearing as tolerated to left leg   T. Medford Amber, PA-C [facility] 10/19/2024, 8:02 AM   "

## 2024-10-19 NOTE — Progress Notes (Signed)
 Physical Therapy Treatment Patient Details Name: Jason Dickson MRN: [id number] DOB: August 13, 1961 Today's Date: 10/19/2024   History of Present Illness Patient is a 63 year old male with  left hip osteoarthritis s/p left total hip arthroplasty.    PT Comments  Pt was seated in recliner upon arrival. He is A and O x 4. Agreeable and cooperative throughout. Easily and safely able to stand, ambulate, and perform stairs to simulate home entry. Pt was educated on importance of continued routine mobility, HEP performance, and use of ice pack for inflammation control. Pt states understanding and will benefit form continued skilled PT at DC to maximize his independence and safety with all ADLs.    If plan is discharge home, recommend the following: Assist for transportation     Equipment Recommendations  RW, pt refused need for [facility]      Precautions / Restrictions Precautions Precautions: Fall;Anterior Hip Precaution Booklet Issued: Yes (comment) Restrictions Weight Bearing Restrictions Per Provider Order: Yes LLE Weight Bearing Per Provider Order: Weight bearing as tolerated     Mobility  Bed Mobility Overal bed mobility: Modified Independent   Transfers Overall transfer level: Needs assistance Equipment used: Rolling walker (2 wheels) Transfers: Sit to/from Stand, Bed to chair/wheelchair/BSC Sit to Stand: Supervision   Ambulation/Gait Ambulation/Gait assistance: Supervision Gait Distance (Feet): 250 Feet Assistive device: Rolling walker (2 wheels) Gait Pattern/deviations: Step-to pattern Gait velocity: WNL  General Gait Details: Pt demonstrated safe staedy gait without LOB or safety concerns   Stairs Stairs: Yes Stairs assistance: Supervision Stair Management: One rail Left, Step to pattern, Forwards, Sideways Number of Stairs: 4 General stair comments: Pt demonstrated safe abilities to ascend/descend stairs to simulate home entry    Balance Overall balance assessment:  Needs assistance Sitting-balance support: Feet supported Sitting balance-Leahy Scale: Good  Standing balance support: Bilateral upper extremity supported Standing balance-Leahy Scale: Good     Communication Communication Communication: No apparent difficulties  Cognition Arousal: Alert Behavior During Therapy: WFL for tasks assessed/performed   PT - Cognitive impairments: No apparent impairments   Following commands: Intact      Cueing Cueing Techniques: Verbal cues     General Comments General comments (skin integrity, edema, etc.): reviewed HEP, importance of routine mobility, and all post acute expectations      Pertinent Vitals/Pain Pain Assessment Faces Pain Scale: Hurts little more Pain Location: mid to low back pain (chronic) Pain Descriptors / Indicators: Discomfort     PT Goals (current goals can now be found in the care plan section) Acute Rehab PT Goals Patient Stated Goal: to return to work Progress towards PT goals: Progressing toward goals    Frequency    BID       AM-PAC PT 6 Clicks Mobility   Outcome Measure  Help needed turning from your back to your side while in a flat bed without using bedrails?: None Help needed moving from lying on your back to sitting on the side of a flat bed without using bedrails?: A Little Help needed moving to and from a bed to a chair (including a wheelchair)?: A Little Help needed standing up from a chair using your arms (e.g., wheelchair or bedside chair)?: A Little Help needed to walk in hospital room?: A Little Help needed climbing 3-5 steps with a railing? : A Little 6 Click Score: 19    End of Session Equipment Utilized During Treatment: Gait belt Activity Tolerance: Patient tolerated treatment well Patient left: with call bell/phone within reach;in chair;with nursing/sitter  in room Nurse Communication: Mobility status (mild knee buckling with weight bearing) PT Visit Diagnosis: Difficulty in walking,  not elsewhere classified (R26.2);Other abnormalities of gait and mobility (R26.89)     Time: 9155-9099 PT Time Calculation (min) (ACUTE ONLY): 16 min  Charges:    $Gait Training: 8-22 mins PT General Charges $$ ACUTE PT VISIT: 1 Visit                     Rankin Essex PTA 10/19/2024, 9:06 AM

## 2024-10-19 NOTE — Progress Notes (Signed)
 DISCHARGE NOTE:  Pt given discharge instructions and verbalized understanding. TED hose on both legs. Walker and beds to meds medications sent with pt. Pt wheeled to car by staff, wife providing transportation home.

## 2024-10-19 NOTE — Plan of Care (Signed)
   Problem: Activity: Goal: Ability to avoid complications of mobility impairment will improve Outcome: Progressing   Problem: Pain Management: Goal: Pain level will decrease with appropriate interventions Outcome: Progressing   Problem: Skin Integrity: Goal: Will show signs of wound healing Outcome: Progressing

## 2024-10-19 NOTE — Anesthesia Postprocedure Evaluation (Signed)
"   Anesthesia Post Note  Patient: Alixander Rallis Scott  Procedure(s) Performed: ARTHROPLASTY, HIP, TOTAL, ANTERIOR APPROACH (Left: Hip)  Patient location during evaluation: Nursing Unit Anesthesia Type: Combined General/Spinal Level of consciousness: oriented and awake and alert Pain management: pain level controlled Vital Signs Assessment: post-procedure vital signs reviewed and stable Respiratory status: spontaneous breathing and respiratory function stable Cardiovascular status: blood pressure returned to baseline and stable Postop Assessment: no headache, no backache, no apparent nausea or vomiting and patient able to bend at knees Anesthetic complications: no   No notable events documented.   Last Vitals:  Vitals:   10/18/24 1952 10/19/24 0552  BP: 134/70 (!) 117/53  Pulse: 97 72  Resp: 18 18  Temp: (!) 36.2 C 36.7 C  SpO2: 99% 98%    Last Pain:  Vitals:   10/19/24 0600  TempSrc:   PainSc: 3                  Dalores Weger I Oscar Hank      "

## 2024-10-19 NOTE — Plan of Care (Signed)
  Problem: Clinical Measurements: Goal: Postoperative complications will be avoided or minimized Outcome: Progressing
